# Patient Record
Sex: Female | Born: 1955 | Race: White | Hispanic: No | Marital: Single | Smoking: Current some day smoker
Health system: Southern US, Community
[De-identification: ages and names within clinical notes are randomized; demographics above are authoritative.]

## PROBLEM LIST (undated history)

## (undated) DIAGNOSIS — F79 Unspecified intellectual disabilities: Secondary | ICD-10-CM

## (undated) DIAGNOSIS — N200 Calculus of kidney: Secondary | ICD-10-CM

## (undated) DIAGNOSIS — I7121 Aneurysm of the ascending aorta, without rupture: Secondary | ICD-10-CM

## (undated) DIAGNOSIS — R05 Cough: Secondary | ICD-10-CM

## (undated) DIAGNOSIS — Z8489 Family history of other specified conditions: Secondary | ICD-10-CM

## (undated) DIAGNOSIS — I712 Thoracic aortic aneurysm, without rupture: Secondary | ICD-10-CM

## (undated) DIAGNOSIS — A419 Sepsis, unspecified organism: Secondary | ICD-10-CM

## (undated) HISTORY — DX: Unspecified intellectual disabilities: F79

---

## 2003-02-13 ENCOUNTER — Emergency Department (HOSPITAL_COMMUNITY): Admission: EM | Admit: 2003-02-13 | Discharge: 2003-02-13 | Payer: Self-pay | Admitting: Emergency Medicine

## 2003-09-10 ENCOUNTER — Other Ambulatory Visit: Admission: RE | Admit: 2003-09-10 | Discharge: 2003-09-10 | Payer: Self-pay | Admitting: Obstetrics and Gynecology

## 2017-10-31 ENCOUNTER — Emergency Department (HOSPITAL_COMMUNITY): Payer: Medicare Other | Admitting: Certified Registered Nurse Anesthetist

## 2017-10-31 ENCOUNTER — Encounter (HOSPITAL_COMMUNITY)
Admission: EM | Disposition: A | Discharge status: Home Health | Payer: Self-pay | Source: Home / Self Care | Attending: Internal Medicine

## 2017-10-31 ENCOUNTER — Other Ambulatory Visit: Payer: Self-pay

## 2017-10-31 ENCOUNTER — Inpatient Hospital Stay (HOSPITAL_COMMUNITY): Payer: Medicare Other

## 2017-10-31 ENCOUNTER — Encounter: Payer: Self-pay | Admitting: Emergency Medicine

## 2017-10-31 ENCOUNTER — Inpatient Hospital Stay (HOSPITAL_BASED_OUTPATIENT_CLINIC_OR_DEPARTMENT_OTHER)
Admission: EM | Admit: 2017-10-31 | Discharge: 2017-11-04 | DRG: 853 | Disposition: A | Discharge status: Home Health | Payer: Medicare Other | Attending: Internal Medicine | Admitting: Internal Medicine

## 2017-10-31 ENCOUNTER — Emergency Department (HOSPITAL_BASED_OUTPATIENT_CLINIC_OR_DEPARTMENT_OTHER): Payer: Medicare Other

## 2017-10-31 DIAGNOSIS — N179 Acute kidney failure, unspecified: Secondary | ICD-10-CM | POA: Diagnosis present

## 2017-10-31 DIAGNOSIS — E876 Hypokalemia: Secondary | ICD-10-CM | POA: Diagnosis present

## 2017-10-31 DIAGNOSIS — N200 Calculus of kidney: Secondary | ICD-10-CM

## 2017-10-31 DIAGNOSIS — A419 Sepsis, unspecified organism: Secondary | ICD-10-CM

## 2017-10-31 DIAGNOSIS — R911 Solitary pulmonary nodule: Secondary | ICD-10-CM | POA: Insufficient documentation

## 2017-10-31 DIAGNOSIS — F79 Unspecified intellectual disabilities: Secondary | ICD-10-CM | POA: Diagnosis present

## 2017-10-31 DIAGNOSIS — D649 Anemia, unspecified: Secondary | ICD-10-CM | POA: Diagnosis present

## 2017-10-31 DIAGNOSIS — N202 Calculus of kidney with calculus of ureter: Secondary | ICD-10-CM | POA: Diagnosis present

## 2017-10-31 DIAGNOSIS — R6521 Severe sepsis with septic shock: Secondary | ICD-10-CM | POA: Diagnosis present

## 2017-10-31 DIAGNOSIS — F17200 Nicotine dependence, unspecified, uncomplicated: Secondary | ICD-10-CM | POA: Diagnosis present

## 2017-10-31 DIAGNOSIS — I712 Thoracic aortic aneurysm, without rupture, unspecified: Secondary | ICD-10-CM

## 2017-10-31 DIAGNOSIS — Z9889 Other specified postprocedural states: Secondary | ICD-10-CM

## 2017-10-31 DIAGNOSIS — N211 Calculus in urethra: Secondary | ICD-10-CM | POA: Diagnosis present

## 2017-10-31 DIAGNOSIS — Z888 Allergy status to other drugs, medicaments and biological substances status: Secondary | ICD-10-CM | POA: Diagnosis not present

## 2017-10-31 DIAGNOSIS — N136 Pyonephrosis: Secondary | ICD-10-CM | POA: Diagnosis present

## 2017-10-31 DIAGNOSIS — R6520 Severe sepsis without septic shock: Secondary | ICD-10-CM

## 2017-10-31 DIAGNOSIS — N139 Obstructive and reflux uropathy, unspecified: Secondary | ICD-10-CM

## 2017-10-31 HISTORY — DX: Unspecified intellectual disabilities: F79

## 2017-10-31 HISTORY — DX: Severe sepsis with septic shock: R65.21

## 2017-10-31 HISTORY — DX: Sepsis, unspecified organism: A41.9

## 2017-10-31 HISTORY — PX: CYSTOSCOPY W/ URETERAL STENT PLACEMENT: SHX1429

## 2017-10-31 HISTORY — DX: Calculus of kidney: N20.0

## 2017-10-31 LAB — COMPREHENSIVE METABOLIC PANEL
ALK PHOS: 48 U/L (ref 38–126)
ALT: 12 U/L — AB (ref 14–54)
ANION GAP: 13 (ref 5–15)
AST: 36 U/L (ref 15–41)
Albumin: 3.4 g/dL — ABNORMAL LOW (ref 3.5–5.0)
BILIRUBIN TOTAL: 0.8 mg/dL (ref 0.3–1.2)
BUN: 17 mg/dL (ref 6–20)
CALCIUM: 9.4 mg/dL (ref 8.9–10.3)
CO2: 21 mmol/L — ABNORMAL LOW (ref 22–32)
CREATININE: 1.21 mg/dL — AB (ref 0.44–1.00)
Chloride: 103 mmol/L (ref 101–111)
GFR, EST AFRICAN AMERICAN: 54 mL/min — AB (ref >60)
GFR, EST NON AFRICAN AMERICAN: 47 mL/min — AB (ref >60)
Glucose, Bld: 119 mg/dL — ABNORMAL HIGH (ref 65–99)
Potassium: 3.3 mmol/L — ABNORMAL LOW (ref 3.5–5.1)
SODIUM: 137 mmol/L (ref 135–145)
TOTAL PROTEIN: 7 g/dL (ref 6.5–8.1)

## 2017-10-31 LAB — CBC WITH DIFFERENTIAL/PLATELET
BASOS ABS: 0 10*3/uL (ref 0.0–0.1)
Basophils Relative: 0 %
EOS ABS: 0 10*3/uL (ref 0.0–0.7)
EOS PCT: 0 %
HCT: 32.7 % — ABNORMAL LOW (ref 36.0–46.0)
Hemoglobin: 10.8 g/dL — ABNORMAL LOW (ref 12.0–15.0)
Lymphocytes Relative: 2 %
Lymphs Abs: 0.3 10*3/uL — ABNORMAL LOW (ref 0.7–4.0)
MCH: 28.6 pg (ref 26.0–34.0)
MCHC: 33 g/dL (ref 30.0–36.0)
MCV: 86.5 fL (ref 78.0–100.0)
MONO ABS: 0.2 10*3/uL (ref 0.1–1.0)
Monocytes Relative: 1 %
Neutro Abs: 13.3 10*3/uL — ABNORMAL HIGH (ref 1.7–7.7)
Neutrophils Relative %: 97 %
PLATELETS: 312 10*3/uL (ref 150–400)
RBC: 3.78 MIL/uL — AB (ref 3.87–5.11)
RDW: 13.7 % (ref 11.5–15.5)
WBC: 13.8 10*3/uL — AB (ref 4.0–10.5)

## 2017-10-31 LAB — I-STAT CG4 LACTIC ACID, ED
LACTIC ACID, VENOUS: 4.25 mmol/L — AB (ref 0.5–1.9)
LACTIC ACID, VENOUS: 5.61 mmol/L — AB (ref 0.5–1.9)

## 2017-10-31 LAB — PROTIME-INR
INR: 1.04
PROTHROMBIN TIME: 13.5 s (ref 11.4–15.2)

## 2017-10-31 SURGERY — CYSTOSCOPY, FLEXIBLE, WITH STENT REPLACEMENT
Anesthesia: General | Laterality: Left

## 2017-10-31 MED ORDER — NOREPINEPHRINE 4 MG/250ML-% IV SOLN
5.0000 ug/min | INTRAVENOUS | Status: DC
  Filled 2017-10-31: qty 250

## 2017-10-31 MED ORDER — MIDAZOLAM HCL 2 MG/2ML IJ SOLN: 2.0000 mg | INTRAMUSCULAR | Status: DC | PRN

## 2017-10-31 MED ORDER — PHENYLEPHRINE HCL 10 MG/ML IJ SOLN
INTRAVENOUS | Status: DC | PRN
  Administered 2017-10-31: 80 ug/min via INTRAVENOUS

## 2017-10-31 MED ORDER — PIPERACILLIN-TAZOBACTAM 3.375 G IVPB 30 MIN: 3.3750 g | Freq: Once | INTRAVENOUS | Status: DC

## 2017-10-31 MED ORDER — LACTATED RINGERS IV SOLN
INTRAVENOUS | Status: DC | PRN
  Administered 2017-10-31: 23:00:00 via INTRAVENOUS

## 2017-10-31 MED ORDER — LIDOCAINE 2% (20 MG/ML) 5 ML SYRINGE
INTRAMUSCULAR | Status: DC | PRN
  Administered 2017-10-31: 50 mg via INTRAVENOUS

## 2017-10-31 MED ORDER — SUCCINYLCHOLINE CHLORIDE 200 MG/10ML IV SOSY
PREFILLED_SYRINGE | INTRAVENOUS | Status: AC
  Filled 2017-10-31: qty 10

## 2017-10-31 MED ORDER — PROPOFOL 10 MG/ML IV BOLUS
INTRAVENOUS | Status: DC | PRN
  Administered 2017-10-31: 80 mg via INTRAVENOUS

## 2017-10-31 MED ORDER — ONDANSETRON HCL 4 MG/2ML IJ SOLN
4.0000 mg | Freq: Once | INTRAMUSCULAR | Status: AC
  Administered 2017-10-31: 4 mg via INTRAVENOUS
  Filled 2017-10-31: qty 2

## 2017-10-31 MED ORDER — ACETAMINOPHEN 160 MG/5ML PO SOLN
650.0000 mg | Freq: Four times a day (QID) | ORAL | Status: DC | PRN
  Administered 2017-11-02: 650 mg via ORAL
  Filled 2017-10-31: qty 20.3

## 2017-10-31 MED ORDER — SUCCINYLCHOLINE CHLORIDE 200 MG/10ML IV SOSY
PREFILLED_SYRINGE | INTRAVENOUS | Status: DC | PRN
  Administered 2017-10-31: 80 mg via INTRAVENOUS

## 2017-10-31 MED ORDER — PIPERACILLIN-TAZOBACTAM 3.375 G IVPB
3.3750 g | Freq: Three times a day (TID) | INTRAVENOUS | Status: DC
  Administered 2017-11-01 – 2017-11-04 (×11): 3.375 g via INTRAVENOUS
  Filled 2017-10-31 (×13): qty 50

## 2017-10-31 MED ORDER — POTASSIUM CHLORIDE 10 MEQ/100ML IV SOLN
10.0000 meq | INTRAVENOUS | Status: DC
  Filled 2017-10-31 (×4): qty 100

## 2017-10-31 MED ORDER — PIPERACILLIN-TAZOBACTAM 3.375 G IVPB 30 MIN
3.3750 g | Freq: Once | INTRAVENOUS | Status: AC
  Administered 2017-10-31: 3.375 g via INTRAVENOUS
  Filled 2017-10-31 (×2): qty 50

## 2017-10-31 MED ORDER — SODIUM CHLORIDE 0.9 % IV SOLN: 250.0000 mL | INTRAVENOUS | Status: DC | PRN

## 2017-10-31 MED ORDER — VANCOMYCIN HCL IN DEXTROSE 1-5 GM/200ML-% IV SOLN: 1000.0000 mg | Freq: Once | INTRAVENOUS | Status: DC

## 2017-10-31 MED ORDER — DEXAMETHASONE SODIUM PHOSPHATE 10 MG/ML IJ SOLN
INTRAMUSCULAR | Status: DC | PRN
  Administered 2017-10-31: 10 mg via INTRAVENOUS

## 2017-10-31 MED ORDER — FENTANYL BOLUS VIA INFUSION
50.0000 ug | INTRAVENOUS | Status: DC | PRN
  Filled 2017-10-31: qty 50

## 2017-10-31 MED ORDER — HEPARIN SODIUM (PORCINE) 5000 UNIT/ML IJ SOLN
5000.0000 [IU] | Freq: Three times a day (TID) | INTRAMUSCULAR | Status: DC
  Administered 2017-11-01 – 2017-11-04 (×9): 5000 [IU] via SUBCUTANEOUS
  Filled 2017-10-31 (×11): qty 1

## 2017-10-31 MED ORDER — SODIUM CHLORIDE 0.9 % IV SOLN
INTRAVENOUS | Status: DC | PRN
  Administered 2017-10-31: 50 mL

## 2017-10-31 MED ORDER — STERILE WATER FOR IRRIGATION IR SOLN
Status: DC | PRN
  Administered 2017-10-31: 3000 mL

## 2017-10-31 MED ORDER — ONDANSETRON HCL 4 MG/2ML IJ SOLN
INTRAMUSCULAR | Status: AC
  Filled 2017-10-31: qty 2

## 2017-10-31 MED ORDER — FENTANYL 2500MCG IN NS 250ML (10MCG/ML) PREMIX INFUSION
25.0000 ug/h | INTRAVENOUS | Status: DC
  Filled 2017-10-31: qty 250

## 2017-10-31 MED ORDER — LACTATED RINGERS IV SOLN
INTRAVENOUS | Status: DC
  Administered 2017-11-01 (×3): via INTRAVENOUS

## 2017-10-31 MED ORDER — ONDANSETRON HCL 4 MG/2ML IJ SOLN
4.0000 mg | Freq: Four times a day (QID) | INTRAMUSCULAR | Status: DC | PRN
Start: 2017-10-31 — End: 2017-11-04

## 2017-10-31 MED ORDER — ONDANSETRON HCL 4 MG/2ML IJ SOLN
INTRAMUSCULAR | Status: DC | PRN
  Administered 2017-10-31: 4 mg via INTRAVENOUS

## 2017-10-31 MED ORDER — INSULIN ASPART 100 UNIT/ML ~~LOC~~ SOLN: 1.0000 [IU] | SUBCUTANEOUS | Status: DC

## 2017-10-31 MED ORDER — ACETAMINOPHEN 500 MG PO TABS
1000.0000 mg | ORAL_TABLET | Freq: Once | ORAL | Status: AC
  Administered 2017-10-31: 1000 mg via ORAL

## 2017-10-31 MED ORDER — VANCOMYCIN HCL IN DEXTROSE 1-5 GM/200ML-% IV SOLN
1000.0000 mg | Freq: Once | INTRAVENOUS | Status: AC
  Administered 2017-10-31: 1000 mg via INTRAVENOUS
  Filled 2017-10-31: qty 200

## 2017-10-31 MED ORDER — DOCUSATE SODIUM 50 MG/5ML PO LIQD
100.0000 mg | Freq: Two times a day (BID) | ORAL | Status: DC
  Administered 2017-11-01 – 2017-11-02 (×3): 100 mg
  Filled 2017-10-31 (×5): qty 10

## 2017-10-31 MED ORDER — FENTANYL CITRATE (PF) 100 MCG/2ML IJ SOLN: 50.0000 ug | Freq: Once | INTRAMUSCULAR | Status: DC

## 2017-10-31 MED ORDER — LIDOCAINE 2% (20 MG/ML) 5 ML SYRINGE
INTRAMUSCULAR | Status: AC
  Filled 2017-10-31: qty 5

## 2017-10-31 MED ORDER — SODIUM CHLORIDE 0.9 % IV BOLUS (SEPSIS)
500.0000 mL | Freq: Once | INTRAVENOUS | Status: AC
  Administered 2017-10-31: 500 mL via INTRAVENOUS

## 2017-10-31 MED ORDER — FENTANYL CITRATE (PF) 100 MCG/2ML IJ SOLN
INTRAMUSCULAR | Status: AC
  Filled 2017-10-31: qty 2

## 2017-10-31 MED ORDER — VANCOMYCIN HCL IN DEXTROSE 750-5 MG/150ML-% IV SOLN
750.0000 mg | INTRAVENOUS | Status: DC
  Administered 2017-11-01: 750 mg via INTRAVENOUS
  Filled 2017-10-31 (×2): qty 150

## 2017-10-31 MED ORDER — IOPAMIDOL (ISOVUE-300) INJECTION 61%
100.0000 mL | Freq: Once | INTRAVENOUS | Status: AC | PRN
  Administered 2017-10-31: 100 mL via INTRAVENOUS

## 2017-10-31 MED ORDER — DEXAMETHASONE SODIUM PHOSPHATE 10 MG/ML IJ SOLN
INTRAMUSCULAR | Status: AC
  Filled 2017-10-31: qty 1

## 2017-10-31 MED ORDER — SODIUM CHLORIDE 0.9 % IV BOLUS (SEPSIS)
250.0000 mL | Freq: Once | INTRAVENOUS | Status: AC
  Administered 2017-10-31: 250 mL via INTRAVENOUS

## 2017-10-31 MED ORDER — PANTOPRAZOLE SODIUM 40 MG IV SOLR: 40.0000 mg | Freq: Every day | INTRAVENOUS | Status: DC

## 2017-10-31 MED ORDER — PROPOFOL 10 MG/ML IV BOLUS
INTRAVENOUS | Status: AC
  Filled 2017-10-31: qty 20

## 2017-10-31 MED ORDER — ACETAMINOPHEN 500 MG PO TABS
ORAL_TABLET | ORAL | Status: AC
  Filled 2017-10-31: qty 2

## 2017-10-31 MED ORDER — SODIUM CHLORIDE 0.9 % IV BOLUS (SEPSIS)
1000.0000 mL | Freq: Once | INTRAVENOUS | Status: AC
  Administered 2017-10-31: 1000 mL via INTRAVENOUS

## 2017-10-31 MED ORDER — FENTANYL CITRATE (PF) 100 MCG/2ML IJ SOLN
INTRAMUSCULAR | Status: DC | PRN
  Administered 2017-10-31: 50 ug via INTRAVENOUS

## 2017-10-31 MED ORDER — ACETAMINOPHEN 650 MG RE SUPP
650.0000 mg | Freq: Four times a day (QID) | RECTAL | Status: DC | PRN
  Filled 2017-10-31: qty 1

## 2017-10-31 MED ORDER — DOCUSATE SODIUM 50 MG/5ML PO LIQD: 100.0000 mg | Freq: Two times a day (BID) | ORAL | Status: DC | PRN

## 2017-10-31 SURGICAL SUPPLY — 15 items
BAG URINE DRAINAGE (UROLOGICAL SUPPLIES) ×3 IMPLANT
BAG URO CATCHER STRL LF (MISCELLANEOUS) ×3 IMPLANT
CATH FOLEY 2WAY SLVR 30CC 16FR (CATHETERS) ×3 IMPLANT
CATH URET 5FR 28IN OPEN ENDED (CATHETERS) ×3 IMPLANT
CLOTH BEACON ORANGE TIMEOUT ST (SAFETY) ×3 IMPLANT
COVER FOOTSWITCH UNIV (MISCELLANEOUS) IMPLANT
COVER SURGICAL LIGHT HANDLE (MISCELLANEOUS) ×3 IMPLANT
GLOVE BIOGEL M STRL SZ7.5 (GLOVE) ×3 IMPLANT
GOWN STRL REUS W/TWL LRG LVL3 (GOWN DISPOSABLE) ×6 IMPLANT
GUIDEWIRE STR DUAL SENSOR (WIRE) ×3 IMPLANT
MANIFOLD NEPTUNE II (INSTRUMENTS) ×3 IMPLANT
PACK CYSTO (CUSTOM PROCEDURE TRAY) ×3 IMPLANT
STENT URET 6FRX24 CONTOUR (STENTS) ×3 IMPLANT
TUBING CONNECTING 10 (TUBING) IMPLANT
TUBING CONNECTING 10' (TUBING)

## 2017-10-31 NOTE — Op Note (Signed)
Preoperative diagnosis:  1. Infected left ureteral stone with sepsis   Postoperative diagnosis:  1. same   Procedure:  1. Cystoscopy 2. left ureteral stent placement 3. left retrograde pyelography with interpretation   Surgeon: Crist FatBenjamin W. Naijah Lacek, MD  Anesthesia: General  Complications: None  Intraoperative findings:  left retrograde pyelography demonstrated a filling defect within the left ureter consistent with the patient's known calculus without other abnormalities.  EBL: Minimal  Specimens: None  Indication: Christina Everett is a 62 y.o. patient with septoid physiology and an obstructing left ureteral stone. After reviewing the management options for treatment, he elected to proceed with the above surgical procedure(s). We have discussed the potential benefits and risks of the procedure, side effects of the proposed treatment, the likelihood of the patient achieving the goals of the procedure, and any potential problems that might occur during the procedure or recuperation. Informed consent has been obtained.  Description of procedure:  The patient was taken to the operating room and general anesthesia was induced.  The patient was placed in the dorsal lithotomy position, prepped and draped in the usual sterile fashion, and preoperative antibiotics were administered. A preoperative time-out was performed.   Cystourethroscopy was performed.  The patient's urethra was examined and was normal. The bladder was then systematically examined in its entirety. There was no evidence for any bladder tumors, stones, or other mucosal pathology.    Attention then turned to the leftureteral orifice and a ureteral catheter was used to intubate the ureteral orifice.  Omnipaque contrast was injected through the ureteral catheter and a retrograde pyelogram was performed with findings as dictated above.  A 0.38 sensor guidewire was then advanced up the left ureter into the renal pelvis under  fluoroscopic guidance.  The wire was then backloaded through the cystoscope and a ureteral stent was advance over the wire using Seldinger technique.  The stent was positioned appropriately under fluoroscopic and cystoscopic guidance.  The wire was then removed with an adequate stent curl noted in the renal pelvis as well as in the bladder.  The bladder was then emptied and the procedure ended.  The patient appeared to tolerate the procedure well and without complications.  The patient was able to be awakened and transferred to the recovery unit in satisfactory condition.    Crist FatBenjamin W. Tamla Winkels, M.D.

## 2017-10-31 NOTE — ED Notes (Signed)
Purewick applied to patient's perineal area after perineal care was done with family in attendance.

## 2017-10-31 NOTE — ED Triage Notes (Signed)
Presents with fever of 102.0 at home, nausea, vomiting and diarrhea and leg pain that began this AM. Per sister, who is her caretaker, pt was given 2 tylenol at 3 pm. PT is mentally handicapped. Pt is febrile here at 101.6 and bp is 103/55, very warm to touch.

## 2017-10-31 NOTE — ED Notes (Signed)
Date and time results received: 10/31/17 1837 (use smartphrase ".now" to insert current time)  Test:Istat Lactic acid Critical Value: 4.25  Name of Provider Notified: Dr. Fredderick PhenixBelfi  Orders Received? Or Actions Taken?: New orders noted

## 2017-10-31 NOTE — ED Provider Notes (Signed)
[facility] EMERGENCY DEPARTMENT Provider Note   CSN: [id number] Arrival date & time: 10/31/17  1747     History   Chief Complaint Chief Complaint  Patient presents with  . Fever    HPI Christina Everett is a 62 y.o. female.  Patient is a 62 year old female who has a history of mental retardation who presents with fever.  She lives at home with her older sister.  Her older sister states that she has been more sleepy throughout today.  She has had one episode of diarrhea and several episodes of vomiting.  She has had some chills and muscle cramping.  She has had a little bit of coughing over the last few days.  No reported abdominal pain.  No difficulty urinating.  No wounds or sores.  No rashes.     Past Medical History:  Diagnosis Date  . Mentally disabled     There are no active problems to display for this patient.   History reviewed. No pertinent surgical history.   OB History   None      Home Medications    Prior to Admission medications   Not on File    Family History History reviewed. No pertinent family history.  Social History Social History   Tobacco Use  . Smoking status: Current Some Day Smoker  . Smokeless tobacco: Current User  Substance Use Topics  . Alcohol use: Never    Frequency: Never  . Drug use: Never     Allergies   Patient has no known allergies.   Review of Systems Review of Systems  Constitutional: Positive for chills and fever. Negative for diaphoresis and fatigue.  HENT: Negative for congestion, rhinorrhea and sneezing.   Eyes: Negative.   Respiratory: Negative for cough, chest tightness and shortness of breath.   Cardiovascular: Negative for chest pain and leg swelling.  Gastrointestinal: Positive for diarrhea, nausea and vomiting. Negative for abdominal pain and blood in stool.  Genitourinary: Negative for difficulty urinating, flank pain, frequency and hematuria.  Musculoskeletal: Positive for myalgias.  Negative for arthralgias and back pain.  Skin: Negative for rash.  Neurological: Negative for dizziness, speech difficulty, weakness, numbness and headaches.     Physical Exam Updated Vital Signs BP (!) 103/46   Pulse (!) 111   Temp (!) 104 F (40 C) (Rectal)   Resp (!) 30   Ht 5\' 6"  (1.676 m)   Wt 54.4 kg (120 lb)   SpO2 96%   BMI 19.37 kg/m   Physical Exam  Constitutional: She is oriented to person, place, and time. She appears well-developed and well-nourished.  HENT:  Head: Normocephalic and atraumatic.  Eyes: Pupils are equal, round, and reactive to light.  Neck: Normal range of motion. Neck supple.  Cardiovascular: Regular rhythm and normal heart sounds. Tachycardia present.  Pulmonary/Chest: Effort normal and breath sounds normal. Tachypnea noted. No respiratory distress. She has no wheezes. She has no rales. She exhibits no tenderness.  Abdominal: Soft. Bowel sounds are normal. There is no tenderness. There is no rebound and no guarding.  Musculoskeletal: Normal range of motion. She exhibits no edema.  Lymphadenopathy:    She has no cervical adenopathy.  Neurological: She is alert and oriented to person, place, and time.  Skin: Skin is warm and dry. No rash noted.  Psychiatric: She has a normal mood and affect.     ED Treatments / Results  Labs (all labs ordered are listed, but only abnormal results are displayed) Labs Reviewed  COMPREHENSIVE METABOLIC PANEL - Abnormal; Notable for the following components:      Result Value   Potassium 3.3 (*)    CO2 21 (*)    Glucose, Bld 119 (*)    Creatinine, Ser 1.21 (*)    Albumin 3.4 (*)    ALT 12 (*)    GFR calc non Af Amer 47 (*)    GFR calc Af Amer 54 (*)    All other components within normal limits  CBC WITH DIFFERENTIAL/PLATELET - Abnormal; Notable for the following components:   WBC 13.8 (*)    RBC 3.78 (*)    Hemoglobin 10.8 (*)    HCT 32.7 (*)    Neutro Abs 13.3 (*)    Lymphs Abs 0.3 (*)    All other  components within normal limits  I-STAT CG4 LACTIC ACID, ED - Abnormal; Notable for the following components:   Lactic Acid, Venous 4.25 (*)    All other components within normal limits  I-STAT CG4 LACTIC ACID, ED - Abnormal; Notable for the following components:   Lactic Acid, Venous 5.61 (*)    All other components within normal limits  CULTURE, BLOOD (ROUTINE X 2)  CULTURE, BLOOD (ROUTINE X 2)  PROTIME-INR  URINALYSIS, ROUTINE W REFLEX MICROSCOPIC  I-STAT CG4 LACTIC ACID, ED  I-STAT CG4 LACTIC ACID, ED    EKG EKG Interpretation  Date/Time:  Sunday October 31 2017 18:18:12 EDT Ventricular Rate:  119 PR Interval:    QRS Duration: 95 QT Interval:  336 QTC Calculation: 473 R Axis:   -18 Text Interpretation:  Sinus tachycardia Borderline left axis deviation Abnormal R-wave progression, early transition Borderline T wave abnormalities Baseline wander in lead(s) V2 No old tracing to compare Confirmed by Rolan Bucco [phone]) on 10/31/2017 7:45:11 PM   Radiology Dg Chest 2 View  Result Date: 10/31/2017 CLINICAL DATA:  Nausea with vomiting and diarrhea. EXAM: CHEST - 2 VIEW COMPARISON:  None. FINDINGS: Asymmetric masslike opacity identified in the right apex. Lungs are hyperexpanded. No edema or focal airspace consolidation. The cardiopericardial silhouette is within normal limits for size. The visualized bony structures of the thorax are intact. IMPRESSION: Asymmetric masslike density in the right apex. CT chest recommended to further evaluate. Electronically Signed   By: Kennith Center M.D.   On: 10/31/2017 18:26   Ct Chest W Contrast  Result Date: 10/31/2017 CLINICAL DATA:  Generalized abdominal pain with fever of unknown origin. EXAM: CT CHEST, ABDOMEN AND PELVIS WITHOUT CONTRAST TECHNIQUE: Multidetector CT imaging of the chest, abdomen and pelvis was performed following the standard protocol without IV contrast. COMPARISON:  Chest x-ray from earlier today FINDINGS: CT CHEST FINDINGS  Cardiovascular: Heart size upper normal to mildly increased. No pericardial effusion. Coronary artery calcification is evident. Ascending thoracic aorta measures up to 4.3 cm diameter. Mediastinum/Nodes: No mediastinal lymphadenopathy. 3.3 x 1.0 x 2.7 cm homogeneous lesion is identified in the left mediastinum, the level of the AP window. This lesion has an imperceptible wall and approaches water attenuation, most consistent with a cyst. There is no hilar lymphadenopathy. There is no axillary lymphadenopathy. Lungs/Pleura: Biapical pleuroparenchymal scarring is identified. By CT, the findings are symmetric and platelike, most compatible with scarring. The asymmetry suggested on the chest x-ray earlier today is not apparent on CT. 7 mm left upper lobe nodule (image 25/series 4) may also be scarring related. Expiratory imaging of the lung shows subtle mosaic attenuation, likely related to air trapping. No dense focal airspace consolidation. No pleural effusion. Musculoskeletal:  Bone windows reveal no worrisome lytic or sclerotic osseous lesions. CT ABDOMEN PELVIS FINDINGS Hepatobiliary: No focal abnormality within the liver parenchyma. There is no evidence for gallstones, gallbladder wall thickening, or pericholecystic fluid. No intrahepatic or extrahepatic biliary dilation. Pancreas: No focal mass lesion. No dilatation of the main duct. No intraparenchymal cyst. No peripancreatic edema. Spleen: No splenomegaly. No focal mass lesion. Adrenals/Urinary Tract: No adrenal nodule or mass. 2 mm nonobstructing stone identified lower pole right kidney. Right kidney otherwise unremarkable. Right ureter is normal. Moderate to high-grade left hydronephrosis is identified with a 2.1 x 1.4 x 1.3 cm gas containing stone lodged at the UPJ. Decreased perfusion to the left kidney is compatible with obstructive uropathy. Gas bubbles are seen in the dilated left renal pelvis and in the dilated left renal calices. Left ureter distal to  the UPJ is nondilated, but multiple gas bubbles are seen in the mid and distal segments. Urinary bladder is moderately distended with intraluminal gas. Stomach/Bowel: Tiny hiatal hernia. Stomach otherwise unremarkable. Duodenum is normally positioned as is the ligament of Treitz. No small bowel wall thickening. No small bowel dilatation. The terminal ileum is normal. The appendix is not visualized, but there is no edema or inflammation in the region of the cecum. Moderate stool volume in the right colon with decompressed transverse and left colon. No obstructing mass lesion evident although motion artifact degrades imaging through the mid transverse segment. Vascular/Lymphatic: There is abdominal aortic atherosclerosis without aneurysm. There is no gastrohepatic or hepatoduodenal ligament lymphadenopathy. No intraperitoneal or retroperitoneal lymphadenopathy. Small left para-aortic lymph nodes are evident. No pelvic sidewall lymphadenopathy. Reproductive: The uterus has normal CT imaging appearance. There is no adnexal mass. Other: No intraperitoneal free fluid. Musculoskeletal: Bone windows reveal no worrisome lytic or sclerotic osseous lesions. 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 IMPRESSION: 1. Moderate to high-grade left hydroureteronephrosis with decreased perfusion of the left kidney consistent with obstructive uropathy. These changes are related to a 2.1 X 1.4 x 1.3 cm gas containing stone lodged at the UPJ. Additional gas is identified in the dilated renal pelvis and collecting systems of the left kidney as well as along the course of the nondilated left ureter distal to the stone and in the urinary bladder. Imaging features compatible with infection and left pyonephrosis. 2. Symmetric biapical pleuroparenchymal scarring by CT. No evidence for right apical mass as suggested on chest x-ray earlier today. 3. 7 mm left upper lobe pulmonary nodule may  be scar. Non-contrast chest CT at 6-12 months is recommended. If the nodule is stable at time of repeat CT, then future CT at 18-24 months (from today's scan) is considered optional for low-risk patients, but is recommended for high-risk patients. This recommendation follows the consensus statement: Guidelines for Management of Incidental Pulmonary Nodules Detected on CT Images: From the Fleischner Society 2017; Radiology 2017; 284:228-243. 4. 4.3 cm diameter ascending thoracic aortic aneurysm. Recommend annual imaging followup by CTA or MRA. This recommendation follows 2010 ACCF/AHA/AATS/ACR/ASA/SCA/SCAI/SIR/STS/SVM Guidelines for the Diagnosis and Management of Patients with Thoracic Aortic Disease. Circulation. 2010; 121: e266-e369 5. 3.3 x 1.0 x 2.7 cm cystic lesion in the left mediastinum. Imaging features most suggestive of pericardial cyst. Attention on follow-up recommended to ensure stability. 6. 2 mm nonobstructing right renal stone. 7.  Aortic Atherosclerois (ICD10-170.0) Electronically Signed   By: Kennith Center M.D.   On: 10/31/2017 20:35   Ct Abdomen Pelvis W Contrast  Result Date: 10/31/2017 CLINICAL  DATA:  Generalized abdominal pain with fever of unknown origin. EXAM: CT CHEST, ABDOMEN AND PELVIS WITHOUT CONTRAST TECHNIQUE: Multidetector CT imaging of the chest, abdomen and pelvis was performed following the standard protocol without IV contrast. COMPARISON:  Chest x-ray from earlier today FINDINGS: CT CHEST FINDINGS Cardiovascular: Heart size upper normal to mildly increased. No pericardial effusion. Coronary artery calcification is evident. Ascending thoracic aorta measures up to 4.3 cm diameter. Mediastinum/Nodes: No mediastinal lymphadenopathy. 3.3 x 1.0 x 2.7 cm homogeneous lesion is identified in the left mediastinum, the level of the AP window. This lesion has an imperceptible wall and approaches water attenuation, most consistent with a cyst. There is no hilar lymphadenopathy. There is no  axillary lymphadenopathy. Lungs/Pleura: Biapical pleuroparenchymal scarring is identified. By CT, the findings are symmetric and platelike, most compatible with scarring. The asymmetry suggested on the chest x-ray earlier today is not apparent on CT. 7 mm left upper lobe nodule (image 25/series 4) may also be scarring related. Expiratory imaging of the lung shows subtle mosaic attenuation, likely related to air trapping. No dense focal airspace consolidation. No pleural effusion. Musculoskeletal: Bone windows reveal no worrisome lytic or sclerotic osseous lesions. CT ABDOMEN PELVIS FINDINGS Hepatobiliary: No focal abnormality within the liver parenchyma. There is no evidence for gallstones, gallbladder wall thickening, or pericholecystic fluid. No intrahepatic or extrahepatic biliary dilation. Pancreas: No focal mass lesion. No dilatation of the main duct. No intraparenchymal cyst. No peripancreatic edema. Spleen: No splenomegaly. No focal mass lesion. Adrenals/Urinary Tract: No adrenal nodule or mass. 2 mm nonobstructing stone identified lower pole right kidney. Right kidney otherwise unremarkable. Right ureter is normal. Moderate to high-grade left hydronephrosis is identified with a 2.1 x 1.4 x 1.3 cm gas containing stone lodged at the UPJ. Decreased perfusion to the left kidney is compatible with obstructive uropathy. Gas bubbles are seen in the dilated left renal pelvis and in the dilated left renal calices. Left ureter distal to the UPJ is nondilated, but multiple gas bubbles are seen in the mid and distal segments. Urinary bladder is moderately distended with intraluminal gas. Stomach/Bowel: Tiny hiatal hernia. Stomach otherwise unremarkable. Duodenum is normally positioned as is the ligament of Treitz. No small bowel wall thickening. No small bowel dilatation. The terminal ileum is normal. The appendix is not visualized, but there is no edema or inflammation in the region of the cecum. Moderate stool volume  in the right colon with decompressed transverse and left colon. No obstructing mass lesion evident although motion artifact degrades imaging through the mid transverse segment. Vascular/Lymphatic: There is abdominal aortic atherosclerosis without aneurysm. There is no gastrohepatic or hepatoduodenal ligament lymphadenopathy. No intraperitoneal or retroperitoneal lymphadenopathy. Small left para-aortic lymph nodes are evident. No pelvic sidewall lymphadenopathy. Reproductive: The uterus has normal CT imaging appearance. There is no adnexal mass. Other: No intraperitoneal free fluid. Musculoskeletal: Bone windows reveal no worrisome lytic or sclerotic osseous lesions. 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 IMPRESSION: 1. Moderate to high-grade left hydroureteronephrosis with decreased perfusion of the left kidney consistent with obstructive uropathy. These changes are related to a 2.1 X 1.4 x 1.3 cm gas containing stone lodged at the UPJ. Additional gas is identified in the dilated renal pelvis and collecting systems of the left kidney as well as along the course of the nondilated left ureter distal to the stone and in the urinary bladder. Imaging features compatible with infection and left pyonephrosis. 2. Symmetric biapical pleuroparenchymal scarring by  CT. No evidence for right apical mass as suggested on chest x-ray earlier today. 3. 7 mm left upper lobe pulmonary nodule may be scar. Non-contrast chest CT at 6-12 months is recommended. If the nodule is stable at time of repeat CT, then future CT at 18-24 months (from today's scan) is considered optional for low-risk patients, but is recommended for high-risk patients. This recommendation follows the consensus statement: Guidelines for Management of Incidental Pulmonary Nodules Detected on CT Images: From the Fleischner Society 2017; Radiology 2017; 284:228-243. 4. 4.3 cm diameter ascending thoracic aortic  aneurysm. Recommend annual imaging followup by CTA or MRA. This recommendation follows 2010 ACCF/AHA/AATS/ACR/ASA/SCA/SCAI/SIR/STS/SVM Guidelines for the Diagnosis and Management of Patients with Thoracic Aortic Disease. Circulation. 2010; 121: [id number] 5. 3.3 x 1.0 x 2.7 cm cystic lesion in the left mediastinum. Imaging features most suggestive of pericardial cyst. Attention on follow-up recommended to ensure stability. 6. 2 mm nonobstructing right renal stone. 7.  Aortic Atherosclerois (ICD10-170.0) Electronically Signed   By: Kennith Center M.D.   On: 10/31/2017 20:35    Procedures Procedures (including critical care time)  Medications Ordered in ED Medications  vancomycin (VANCOCIN) IVPB 750 mg/150 ml premix (has no administration in time range)  piperacillin-tazobactam (ZOSYN) IVPB 3.375 g (has no administration in time range)  ondansetron (ZOFRAN) injection 4 mg (4 mg Intravenous Given 10/31/17 1828)  sodium chloride 0.9 % bolus 1,000 mL (0 mLs Intravenous Stopped 10/31/17 2005)    And  sodium chloride 0.9 % bolus 500 mL (0 mLs Intravenous Stopped 10/31/17 2005)    And  sodium chloride 0.9 % bolus 250 mL (0 mLs Intravenous Stopped 10/31/17 2042)  piperacillin-tazobactam (ZOSYN) IVPB 3.375 g (0 g Intravenous Stopped 10/31/17 1904)  vancomycin (VANCOCIN) IVPB 1000 mg/200 mL premix (0 mg Intravenous Stopped 10/31/17 2042)  iopamidol (ISOVUE-300) 61 % injection 100 mL (100 mLs Intravenous Contrast Given 10/31/17 1934)  acetaminophen (TYLENOL) tablet 1,000 mg (1,000 mg Oral Given 10/31/17 2005)     Initial Impression / Assessment and Plan / ED Course  I have reviewed the triage vital signs and the nursing notes.  Pertinent labs & imaging results that were available during my care of the patient were reviewed by me and considered in my medical decision making (see chart for details).     Patient is a 62 year old female who presents with fever chills and vomiting.  She has a mild cough but no abdominal  pain on exam.  However chest x-ray appeared abnormal and a CT scan was ordered.  I added on a CT scan of the abdomen and pelvis which did show severe infection in the left kidney with air bubbles and an obstructing stone.  On arrival, she was treated aggressively with IV antibiotics and for 30 cc/kg fluid bolus.  Her blood pressures have remained soft but she has not required pressors.  Her lactate has climbed in between the fluid boluses.  Her white count is elevated.  Other labs reviewed.  I spoke with Dr. Marlou Porch with urology who requested patient to be transferred to the [facility] emergency department as he likely will need to take her to surgery tonight.  I updated the family on this.  I also spoke with Dr. Fayrene Fearing in the emergency department who has accepted the patient for transfer.  I did also speak with Dr. Julian Reil with the hospitalist service to give him a heads up the patient will need to be admitted post surgery.  It is unclear at this point whether  she will need a stepdown or ICU bed.  CRITICAL CARE Performed by: Rolan Bucco Total critical care time: 75 minutes Critical care time was exclusive of separately billable procedures and treating other patients. Critical care was necessary to treat or prevent imminent or life-threatening deterioration. Critical care was time spent personally by me on the following activities: development of treatment plan with patient and/or surrogate as well as nursing, discussions with consultants, evaluation of patient's response to treatment, examination of patient, obtaining history from patient or surrogate, ordering and performing treatments and interventions, ordering and review of laboratory studies, ordering and review of radiographic studies, pulse oximetry and re-evaluation of patient's condition.   Final Clinical Impressions(s) / ED Diagnoses   Final diagnoses:  Severe sepsis [facility])  Acute pyonephrosis  Kidney stone  Pulmonary nodule  Thoracic  aortic aneurysm without rupture [facility])    ED Discharge Orders    None       Rolan Bucco, MD 10/31/17 2124
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
## 2017-10-31 NOTE — ED Notes (Signed)
EDP notified of pts temp. Verbal order given.

## 2017-10-31 NOTE — ED Notes (Addendum)
[facility]  Informed Misty StanleyLisa that I did not give Zosyn 3.375 prior to discharge.

## 2017-10-31 NOTE — ED Notes (Addendum)
Panic value on LAC of 4.25 hand delivered to Dr. Fredderick PhenixBelfi @ [phone] by this RT.

## 2017-10-31 NOTE — ED Notes (Signed)
Panic value on LAC 5.61 hand delivered to Dr. Fredderick PhenixBelfi at 2023 by this RT.

## 2017-10-31 NOTE — Consult Note (Signed)
I have been asked to see the patient by Dr. Rolan BuccoMelanie Belfi, for evaluation and management of left proximal ureteral stone with associated sepsis.  History of present illness: 62 year old female who presented to the [facility] ED earlier this afternoon with complaints of nausea and vomiting.  The patient is mentally disabled, but has no other significant medical problems.  She was not complaining of any significant pain but rather of chills and muscle cramps.  She also has been more lethargic over the past day.  She has no history of kidney stones or recurrent UTIs.  In the emergency department a CT scan was performed which demonstrated a large 2 cm stone that is obstructing in the left proximal ureter with air in the collecting system and a delayed nephrogram.  Her blood pressure was soft, her heart rate was elevated, and her fever spiked up to 104.  Her lactate is currently rising, the last level was 5.  Given her septic physiology urology was consulted urgently for further management and evaluation.  Review of systems: A 12 point comprehensive review of systems was unable to be obtained due to the patient's mental status.  There are no active problems to display for this patient.   No current facility-administered medications on file prior to encounter.    No current outpatient medications on file prior to encounter.    Past Medical History:  Diagnosis Date  . Mentally disabled     History reviewed. No pertinent surgical history.  Social History   Tobacco Use  . Smoking status: Current Some Day Smoker  . Smokeless tobacco: Current User  Substance Use Topics  . Alcohol use: Never    Frequency: Never  . Drug use: Never    History reviewed. No pertinent family history.  PE: Vitals:   10/31/17 2115 10/31/17 2130 10/31/17 2145 10/31/17 2200  BP: (!) 102/54 (!) 97/47 (!) 94/49 (!) 104/58  Pulse:      Resp: (!) 27 (!) 24 (!) 33 (!) 37  Temp:      TempSrc:      SpO2:       Weight:      Height:       Patient appears to be in mild distress  patient is alert  Atraumatic normocephalic head No cervical or supraclavicular lymphadenopathy appreciated No increased work of breathing, no audible wheezes/rhonchi tachycardic Abdomen is soft, nontender, nondistended Lower extremities are symmetric without appreciable edema Grossly neurologically intact No identifiable skin lesions  Recent Labs    10/31/17 1823  WBC 13.8*  HGB 10.8*  HCT 32.7*   Recent Labs    10/31/17 1823  NA 137  K 3.3*  CL 103  CO2 21*  GLUCOSE 119*  BUN 17  CREATININE 1.21*  CALCIUM 9.4   Recent Labs    10/31/17 1823  INR 1.04   No results for input(s): LABURIN in the last 72 hours. No results found for this or any previous visit.  Imaging: I have reviewed the patient's CT scan which was obtained on 10/31/17 which demonstrates a large obstructing stone with air in the collecting system and delayed nephrogram consistent with a infected left kidney.  Imp: The patient has developed sepsis from an infected obstructing stone.  Recommendations: I spoke with the family about this patient in significant detail and recommended that we proceed to the operating room on an emergent basis to place a stent and drain the kidney infection on the left side.  Following this, the  patient will likely need ICU level care.  Once the patient has been treated adequately for her infection she will need more definitive treatment of that stone.  Berniece Salines W

## 2017-10-31 NOTE — ED Notes (Signed)
Attempted to call [facility] but Press photographerCharge nurse is busy.  She stated that she will call me back.

## 2017-10-31 NOTE — Anesthesia Procedure Notes (Signed)
Procedure Name: Intubation Date/Time: 10/31/2017 11:29 PM Performed by: Montel Clock, CRNA Pre-anesthesia Checklist: Patient identified, Emergency Drugs available, Suction available, Patient being monitored and Timeout performed Patient Re-evaluated:Patient Re-evaluated prior to induction Oxygen Delivery Method: Circle system utilized Preoxygenation: Pre-oxygenation with 100% oxygen Induction Type: IV induction, Cricoid Pressure applied and Rapid sequence Laryngoscope Size: Mac and 3 Grade View: Grade I Tube type: Oral Tube size: 7.0 mm Number of attempts: 1 Airway Equipment and Method: Stylet Placement Confirmation: ETT inserted through vocal cords under direct vision,  positive ETCO2 and breath sounds checked- equal and bilateral Secured at: 20 cm Tube secured with: Tape Dental Injury: Teeth and Oropharynx as per pre-operative assessment

## 2017-10-31 NOTE — Transfer of Care (Signed)
Immediate Anesthesia Transfer of Care Note  Patient: Christina Everett  Procedure(s) Performed: CYSTOSCOPY WITH STENT REPLACEMENT (Left )  Patient Location: PACU  Anesthesia Type:General  Level of Consciousness: drowsy and patient cooperative  Airway & Oxygen Therapy: Patient Spontanous Breathing and Patient connected to face mask oxygen  Post-op Assessment: Report given to RN and Post -op Vital signs reviewed and stable  Post vital signs: Reviewed and stable  Last Vitals:  Vitals Value Taken Time  BP    Temp    Pulse    Resp    SpO2      Last Pain:  Vitals:   10/31/17 2050  TempSrc: Rectal  PainSc:          Complications: No apparent anesthesia complications

## 2017-10-31 NOTE — ED Notes (Signed)
ED Provider at bedside. 

## 2017-10-31 NOTE — Consult Note (Signed)
PULMONARY / CRITICAL CARE MEDICINE   Name: Christina Everett MRN: [id number] DOB: [birth year]    ADMISSION DATE:  10/31/2017 CONSULTATION DATE:  10/31/2017  REFERRING MD:  Dr. Julian Reil   CHIEF COMPLAINT:  Septic Shock with Obstructive Uropathy   HISTORY OF PRESENT ILLNESS:   62 year old female with PMH of mentally disability. Lives with sister.   Presents to [facility] ED Febrile (102), nausea, vomiting, diarrhea, and leg pain. On arrival BP 103/55. LA 4.25. CT A/P with obstructing stone within left ureter with air within collecting system. Treated with 30 cc/kg fluid bolus, and Zosyn/Vancomycin. Urology consulted. Patient transferred to [facility] for stent vs nephrostomy tube placement. PCCM asked to consult.   At time of my exam of patient in PACU, she is anxious and worried about the planned stent placement. She keeps asking if it will hurt. She denies SOB, CP, Abd pain, N/V during my exam. She is very hot to touch.   PAST MEDICAL HISTORY :  She  has a past medical history of Mentally disabled.  PAST SURGICAL HISTORY: She  has no past surgical history on file.  No Known Allergies  No current facility-administered medications on file prior to encounter.    No current outpatient medications on file prior to encounter.   FAMILY HISTORY:  Her has no family status information on file.   SOCIAL HISTORY: She  reports that she has been smoking.  She uses smokeless tobacco. She reports that she does not drink alcohol or use drugs.  REVIEW OF SYSTEMS:   Review of Systems  Constitutional: Positive for fever.  HENT: Negative.   Eyes: Negative.   Respiratory: Negative.   Cardiovascular: Negative.   Gastrointestinal: Positive for diarrhea, nausea and vomiting.  Genitourinary: Negative.   Musculoskeletal: Negative.   Skin: Negative.   Neurological: Negative.   Endo/Heme/Allergies: Negative.   Psychiatric/Behavioral: Negative.    SUBJECTIVE:  Lying on stretcher in PACU,  Awake/alert  VITAL SIGNS: BP (!) 104/58   Pulse (!) 111   Temp (!) 104 F (40 C) (Rectal)   Resp (!) 37   Ht 5\' 6"  (1.676 m)   Wt 54.4 kg (120 lb)   SpO2 96%   BMI 19.37 kg/m   HEMODYNAMICS:  BP 96/58 on no vasopressors   INTAKE / OUTPUT: No intake/output data recorded.  PHYSICAL EXAMINATION: General: WDWN Adult female, Awake/alert, critically ill due to septic shock Neuro: awake/alert, PERRL, moving all extremities  HEENT: OP clear, MM moist  Cardiovascular: Tachycardic with a regular rhythm, no m/r/g Lungs: CTA b/l, speaking in full sentences, no accessory muscle use Abdomen: Soft NTND, BS hypoactive Musculoskeletal: no LE edema  Skin: no rashes; skin very hot to touch  LABS:  BMET Recent Labs  Lab 10/31/17 1823  NA 137  K 3.3*  CL 103  CO2 21*  BUN 17  CREATININE 1.21*  GLUCOSE 119*   Electrolytes Recent Labs  Lab 10/31/17 1823  CALCIUM 9.4   CBC Recent Labs  Lab 10/31/17 1823  WBC 13.8*  HGB 10.8*  HCT 32.7*  PLT 312   Coag's Recent Labs  Lab 10/31/17 1823  INR 1.04   Sepsis Markers Recent Labs  Lab 10/31/17 1827 10/31/17 2019  LATICACIDVEN 4.25* 5.61*   ABG No results for input(s): PHART, PCO2ART, PO2ART in the last 168 hours.  Liver Enzymes Recent Labs  Lab 10/31/17 1823  AST 36  ALT 12*  ALKPHOS 48  BILITOT 0.8  ALBUMIN 3.4*   Cardiac Enzymes  No results for input(s): TROPONINI, PROBNP in the last 168 hours.  Glucose No results for input(s): GLUCAP in the last 168 hours.  Imaging Dg Chest 2 View  Result Date: 10/31/2017 CLINICAL DATA:  Nausea with vomiting and diarrhea. EXAM: CHEST - 2 VIEW COMPARISON:  None. FINDINGS: Asymmetric masslike opacity identified in the right apex. Lungs are hyperexpanded. No edema or focal airspace consolidation. The cardiopericardial silhouette is within normal limits for size. The visualized bony structures of the thorax are intact. IMPRESSION: Asymmetric masslike density in the right  apex. CT chest recommended to further evaluate. Electronically Signed   By: Kennith Center M.D.   On: 10/31/2017 18:26   Ct Chest W Contrast  Result Date: 10/31/2017 CLINICAL DATA:  Generalized abdominal pain with fever of unknown origin. EXAM: CT CHEST, ABDOMEN AND PELVIS WITHOUT CONTRAST TECHNIQUE: Multidetector CT imaging of the chest, abdomen and pelvis was performed following the standard protocol without IV contrast. COMPARISON:  Chest x-ray from earlier today FINDINGS: CT CHEST FINDINGS Cardiovascular: Heart size upper normal to mildly increased. No pericardial effusion. Coronary artery calcification is evident. Ascending thoracic aorta measures up to 4.3 cm diameter. Mediastinum/Nodes: No mediastinal lymphadenopathy. 3.3 x 1.0 x 2.7 cm homogeneous lesion is identified in the left mediastinum, the level of the AP window. This lesion has an imperceptible wall and approaches water attenuation, most consistent with a cyst. There is no hilar lymphadenopathy. There is no axillary lymphadenopathy. Lungs/Pleura: Biapical pleuroparenchymal scarring is identified. By CT, the findings are symmetric and platelike, most compatible with scarring. The asymmetry suggested on the chest x-ray earlier today is not apparent on CT. 7 mm left upper lobe nodule (image 25/series 4) may also be scarring related. Expiratory imaging of the lung shows subtle mosaic attenuation, likely related to air trapping. No dense focal airspace consolidation. No pleural effusion. Musculoskeletal: Bone windows reveal no worrisome lytic or sclerotic osseous lesions. CT ABDOMEN PELVIS FINDINGS Hepatobiliary: No focal abnormality within the liver parenchyma. There is no evidence for gallstones, gallbladder wall thickening, or pericholecystic fluid. No intrahepatic or extrahepatic biliary dilation. Pancreas: No focal mass lesion. No dilatation of the main duct. No intraparenchymal cyst. No peripancreatic edema. Spleen: No splenomegaly. No focal mass  lesion. Adrenals/Urinary Tract: No adrenal nodule or mass. 2 mm nonobstructing stone identified lower pole right kidney. Right kidney otherwise unremarkable. Right ureter is normal. Moderate to high-grade left hydronephrosis is identified with a 2.1 x 1.4 x 1.3 cm gas containing stone lodged at the UPJ. Decreased perfusion to the left kidney is compatible with obstructive uropathy. Gas bubbles are seen in the dilated left renal pelvis and in the dilated left renal calices. Left ureter distal to the UPJ is nondilated, but multiple gas bubbles are seen in the mid and distal segments. Urinary bladder is moderately distended with intraluminal gas. Stomach/Bowel: Tiny hiatal hernia. Stomach otherwise unremarkable. Duodenum is normally positioned as is the ligament of Treitz. No small bowel wall thickening. No small bowel dilatation. The terminal ileum is normal. The appendix is not visualized, but there is no edema or inflammation in the region of the cecum. Moderate stool volume in the right colon with decompressed transverse and left colon. No obstructing mass lesion evident although motion artifact degrades imaging through the mid transverse segment. Vascular/Lymphatic: There is abdominal aortic atherosclerosis without aneurysm. There is no gastrohepatic or hepatoduodenal ligament lymphadenopathy. No intraperitoneal or retroperitoneal lymphadenopathy. Small left para-aortic lymph nodes are evident. No pelvic sidewall lymphadenopathy. Reproductive: The uterus has normal CT imaging appearance.  There is no adnexal mass. Other: No intraperitoneal free fluid. Musculoskeletal: Bone windows reveal no worrisome lytic or sclerotic osseous lesions. 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 IMPRESSION: 1. Moderate to high-grade left hydroureteronephrosis with decreased perfusion of the left kidney consistent with obstructive uropathy. These changes are related to a 2.1 X  1.4 x 1.3 cm gas containing stone lodged at the UPJ. Additional gas is identified in the dilated renal pelvis and collecting systems of the left kidney as well as along the course of the nondilated left ureter distal to the stone and in the urinary bladder. Imaging features compatible with infection and left pyonephrosis. 2. Symmetric biapical pleuroparenchymal scarring by CT. No evidence for right apical mass as suggested on chest x-ray earlier today. 3. 7 mm left upper lobe pulmonary nodule may be scar. Non-contrast chest CT at 6-12 months is recommended. If the nodule is stable at time of repeat CT, then future CT at 18-24 months (from today's scan) is considered optional for low-risk patients, but is recommended for high-risk patients. This recommendation follows the consensus statement: Guidelines for Management of Incidental Pulmonary Nodules Detected on CT Images: From the Fleischner Society 2017; Radiology 2017; 284:228-243. 4. 4.3 cm diameter ascending thoracic aortic aneurysm. Recommend annual imaging followup by CTA or MRA. This recommendation follows 2010 ACCF/AHA/AATS/ACR/ASA/SCA/SCAI/SIR/STS/SVM Guidelines for the Diagnosis and Management of Patients with Thoracic Aortic Disease. Circulation. 2010; 121: e266-e369 5. 3.3 x 1.0 x 2.7 cm cystic lesion in the left mediastinum. Imaging features most suggestive of pericardial cyst. Attention on follow-up recommended to ensure stability. 6. 2 mm nonobstructing right renal stone. 7.  Aortic Atherosclerois (ICD10-170.0) Electronically Signed   By: Kennith Center M.D.   On: 10/31/2017 20:35   Ct Abdomen Pelvis W Contrast  Result Date: 10/31/2017 CLINICAL DATA:  Generalized abdominal pain with fever of unknown origin. EXAM: CT CHEST, ABDOMEN AND PELVIS WITHOUT CONTRAST TECHNIQUE: Multidetector CT imaging of the chest, abdomen and pelvis was performed following the standard protocol without IV contrast. COMPARISON:  Chest x-ray from earlier today FINDINGS: CT  CHEST FINDINGS Cardiovascular: Heart size upper normal to mildly increased. No pericardial effusion. Coronary artery calcification is evident. Ascending thoracic aorta measures up to 4.3 cm diameter. Mediastinum/Nodes: No mediastinal lymphadenopathy. 3.3 x 1.0 x 2.7 cm homogeneous lesion is identified in the left mediastinum, the level of the AP window. This lesion has an imperceptible wall and approaches water attenuation, most consistent with a cyst. There is no hilar lymphadenopathy. There is no axillary lymphadenopathy. Lungs/Pleura: Biapical pleuroparenchymal scarring is identified. By CT, the findings are symmetric and platelike, most compatible with scarring. The asymmetry suggested on the chest x-ray earlier today is not apparent on CT. 7 mm left upper lobe nodule (image 25/series 4) may also be scarring related. Expiratory imaging of the lung shows subtle mosaic attenuation, likely related to air trapping. No dense focal airspace consolidation. No pleural effusion. Musculoskeletal: Bone windows reveal no worrisome lytic or sclerotic osseous lesions. CT ABDOMEN PELVIS FINDINGS Hepatobiliary: No focal abnormality within the liver parenchyma. There is no evidence for gallstones, gallbladder wall thickening, or pericholecystic fluid. No intrahepatic or extrahepatic biliary dilation. Pancreas: No focal mass lesion. No dilatation of the main duct. No intraparenchymal cyst. No peripancreatic edema. Spleen: No splenomegaly. No focal mass lesion. Adrenals/Urinary Tract: No adrenal nodule or mass. 2 mm nonobstructing stone identified lower pole right kidney. Right kidney otherwise unremarkable. Right ureter is normal. Moderate to high-grade left  hydronephrosis is identified with a 2.1 x 1.4 x 1.3 cm gas containing stone lodged at the UPJ. Decreased perfusion to the left kidney is compatible with obstructive uropathy. Gas bubbles are seen in the dilated left renal pelvis and in the dilated left renal calices. Left  ureter distal to the UPJ is nondilated, but multiple gas bubbles are seen in the mid and distal segments. Urinary bladder is moderately distended with intraluminal gas. Stomach/Bowel: Tiny hiatal hernia. Stomach otherwise unremarkable. Duodenum is normally positioned as is the ligament of Treitz. No small bowel wall thickening. No small bowel dilatation. The terminal ileum is normal. The appendix is not visualized, but there is no edema or inflammation in the region of the cecum. Moderate stool volume in the right colon with decompressed transverse and left colon. No obstructing mass lesion evident although motion artifact degrades imaging through the mid transverse segment. Vascular/Lymphatic: There is abdominal aortic atherosclerosis without aneurysm. There is no gastrohepatic or hepatoduodenal ligament lymphadenopathy. No intraperitoneal or retroperitoneal lymphadenopathy. Small left para-aortic lymph nodes are evident. No pelvic sidewall lymphadenopathy. Reproductive: The uterus has normal CT imaging appearance. There is no adnexal mass. Other: No intraperitoneal free fluid. Musculoskeletal: Bone windows reveal no worrisome lytic or sclerotic osseous lesions. 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 IMPRESSION: 1. Moderate to high-grade left hydroureteronephrosis with decreased perfusion of the left kidney consistent with obstructive uropathy. These changes are related to a 2.1 X 1.4 x 1.3 cm gas containing stone lodged at the UPJ. Additional gas is identified in the dilated renal pelvis and collecting systems of the left kidney as well as along the course of the nondilated left ureter distal to the stone and in the urinary bladder. Imaging features compatible with infection and left pyonephrosis. 2. Symmetric biapical pleuroparenchymal scarring by CT. No evidence for right apical mass as suggested on chest x-ray earlier today. 3. 7 mm left upper lobe  pulmonary nodule may be scar. Non-contrast chest CT at 6-12 months is recommended. If the nodule is stable at time of repeat CT, then future CT at 18-24 months (from today's scan) is considered optional for low-risk patients, but is recommended for high-risk patients. This recommendation follows the consensus statement: Guidelines for Management of Incidental Pulmonary Nodules Detected on CT Images: From the Fleischner Society 2017; Radiology 2017; 284:228-243. 4. 4.3 cm diameter ascending thoracic aortic aneurysm. Recommend annual imaging followup by CTA or MRA. This recommendation follows 2010 ACCF/AHA/AATS/ACR/ASA/SCA/SCAI/SIR/STS/SVM Guidelines for the Diagnosis and Management of Patients with Thoracic Aortic Disease. Circulation. 2010; 121: e266-e369 5. 3.3 x 1.0 x 2.7 cm cystic lesion in the left mediastinum. Imaging features most suggestive of pericardial cyst. Attention on follow-up recommended to ensure stability. 6. 2 mm nonobstructing right renal stone. 7.  Aortic Atherosclerois (ICD10-170.0) Electronically Signed   By: Kennith Center M.D.   On: 10/31/2017 20:35   STUDIES:  CXR 4/7 > Asymmetric masslike opacity identified in the right apex. Lungs are hyperexpanded. No edema or focal airspace consolidation. The cardiopericardial silhouette is within normal limits for size. The visualized bony structures of the thorax are intact. CT C/A/P 4/7 > . Moderate to high-grade left hydroureteronephrosis with decreased perfusion of the left kidney consistent with obstructive uropathy. These changes are related to a 2.1 X 1.4 x 1.3 cm gas containing stone lodged at the UPJ. Additional gas is identified in the dilated renal pelvis and collecting systems of the left kidney as well as along the course  of the nondilated left ureter distal to the stone and in the urinary bladder. Imaging features compatible with infection and left pyonephrosis. 2. Symmetric biapical pleuroparenchymal scarring by CT. No evidence for  right apical mass as suggested on chest x-ray earlier today. 3. 7 mm left upper lobe pulmonary nodule may be scar. Non-contrast chest CT at 6-12 months is recommended. If the nodule is stable at time of repeat CT, then future CT at 18-24 months (from today's scan) is considered optional for low-risk patients, but is recommended for high-risk patients. This recommendation follows the consensus statement: Guidelines for Management of Incidental Pulmonary Nodules Detected on CT Images: From the Fleischner Society 2017; Radiology 2017; 284:228-243. 4. 4.3 cm diameter ascending thoracic aortic aneurysm. Recommend annual imaging followup by CTA or MRA. This recommendation follows 2010 ACCF/AHA/AATS/ACR/ASA/SCA/SCAI/SIR/STS/SVM Guidelines for the Diagnosis and Management of Patients with Thoracic Aortic Disease. Circulation. 2010; 121: [id number] 5. 3.3 x 1.0 x 2.7 cm cystic lesion in the left mediastinum. Imaging features most suggestive of pericardial cyst. Attention on follow-up recommended to ensure stability. 6. 2 mm nonobstructing right renal stone. 7.  Aortic Atherosclerois (ICD10-170.0)  CULTURES: Blood 4/7 >>  UA: ordered  ANTIBIOTICS: Vancomycin 4/7 >  Zosyn 4/7 >   SIGNIFICANT EVENTS: 4/7 > Presents to ED > Transferred to [facility]   LINES/TUBES: PIV  Foley 4/7 >>  DISCUSSION: 62 year old female presents to ED with obstructing urethral stone with associated septic shock. [facility] Ureteral stent [facility].   ASSESSMENT / PLAN:  PULMONARY 1. Respiratory Insufficiency in post-operative setting  - intubated for ureteral stent placement; extubated postop to 2L O2 - continue O2 and wean as tolerated - start incentive spirometry and flutter valve.  2. Pulmonary nodule:  - Chest CT showed 7mm LUL pulmonary nodule; recommended repeat Chest CT in 6-12 months.   CARDIOVASCULAR 1. Ascending Thoracic Aortic Aneurysm: - CT Chest revealed 4.193mm ascending thoracic aortic  aneurysm; recommended is annual follow-up with CTA or MRI.   2. Pericardial Cyst: - CT Chest revealed a 3.3 x 1.0 x 2.7 cm cystic lesion in the left mediastinum, for which follow-up is recommended.   RENAL 1. Acute Kidney Injury; Hypokalemia; Hypomagnesemia - creatinine 1.21 on admit, from unknown baseline; increased to 1.30 on recheck.  - s/p 30cc/kg IV bolus; continue LR @ 100cc/hr - Repleted potassium and magnesium; Trend BMP  - Foley catheter is in place; monitor UOP. Avoid nephrotoxic agents.   GASTROINTESTINAL NPO; GI prophylaxis  HEMATOLOGIC 1. Anemia:  - Hgb 10.8 on admit, down from unknown baseline; on repeat Hgb 9.1 likely from hemodilution. Continue to monitor. Transfuse for Hgb <7  INFECTIOUS 1. Septic shock due to Obstructive Uropathy in setting of  Urethral Stone within Left Proximal Ureter  - s/p ureteral stent placement 4/7 PM by Urology; appreciate rec's - s/p 30cc/kg IVF bolus. Required low dose Neo gtt intraoperatively but weaned off post-op. BP remains soft with MAP 67. May need central line placement. - lactate initially increased from 4.25 to 5.61 despite IVF boluses but now improved to 2.7; continue to trend - procalcitonin 6.87; recheck daily.  - cortisol 36 - follow-up urine and blood cultures -Continue Zosyn and Vancomycin   ENDOCRINE NPO; SSI as needed   NEUROLOGIC 1. History of Mental disability:  - per her family she is "childlike" at baseline. She is alert and talkative but does not have understanding of why she is at the hospital other than that she "feels bad."    FAMILY  -  Updates: Updated patient and her sister (who is her caregiver) in the PACU and again later in the ICU.  - Inter-disciplinary family meet or Palliative Care meeting due by: 11/07/17   60 minutes critical care time  Milana Obey, MD  Pulmonary and Critical Care Medicine [facility] Pager: [phone]  11/01/2017, 3:31 AM

## 2017-10-31 NOTE — Progress Notes (Signed)
Spoke with Dr. Fredderick PhenixBelfi, patient with fever, rising lactate, decreased BP with a large obstructing stone in the left proximal ureter with air within the collecting system.  Plan we formulated was for the [facility] where I can see her and we can assess whether we put a ureteral stent in or place a neph tube.  She will need at least a step down bed and should be admitted to the hospital service.

## 2017-10-31 NOTE — Progress Notes (Signed)
Pharmacy Antibiotic Note  Christina BarthelShirley Everett is a 62 y.o. female admitted on 10/31/2017 with N/V and diarrhea.  Pharmacy has been consulted for Vancomycin and Zosyn dosing.  Pt has been ordered Vancomycin 1gm IV x 1 and Zosyn 3.375gm IV x 1 at [facility].  Plan: Vancomycin 1gm IV x 1 (ordered at [facility]) Vancomycin 750mg  IV q24h Goal trough 15-20 mcg/ml Zosyn 3.375 gm IV q8h (4 hour infusion).   Height: 5\' 6"  (167.6 cm) Weight: 120 lb (54.4 kg) IBW/kg (Calculated) : 59.3  Temp (24hrs), Avg:101.6 F (38.7 C), Min:101.6 F (38.7 C), Max:101.6 F (38.7 C)  Recent Labs  Lab 10/31/17 1823 10/31/17 1827  WBC 13.8*  --   CREATININE 1.21*  --   LATICACIDVEN  --  4.25*    Estimated Creatinine Clearance: 40.9 mL/min (A) (by C-G formula based on SCr of 1.21 mg/dL (H)).    No Known Allergies  Antimicrobials this admission: Vanc 4/7 >> Zosyn 4/7 >>  Dose adjustments this admission:   Microbiology results: 4/7  BCx: pending   Thank you for allowing pharmacy to be a part of this patient's care.  Toys 'R' UsKimberly Trinka Keshishyan, Pharm.D., BCPS Clinical Pharmacist Pager: [phone] 10/31/2017 7:02 PM

## 2017-10-31 NOTE — ED Notes (Addendum)
ED Provider at bedside.  Follow up explanation given to family after MD explained that patient will be [facility].  Patient stated that she can sign the consent.   Family at bedside when patient signed the consent.

## 2017-11-01 ENCOUNTER — Encounter (HOSPITAL_COMMUNITY): Payer: Self-pay | Admitting: Urology

## 2017-11-01 ENCOUNTER — Inpatient Hospital Stay (HOSPITAL_COMMUNITY): Payer: Medicare Other

## 2017-11-01 DIAGNOSIS — R6521 Severe sepsis with septic shock: Secondary | ICD-10-CM

## 2017-11-01 DIAGNOSIS — A419 Sepsis, unspecified organism: Principal | ICD-10-CM

## 2017-11-01 DIAGNOSIS — R911 Solitary pulmonary nodule: Secondary | ICD-10-CM

## 2017-11-01 DIAGNOSIS — I712 Thoracic aortic aneurysm, without rupture: Secondary | ICD-10-CM

## 2017-11-01 DIAGNOSIS — N200 Calculus of kidney: Secondary | ICD-10-CM

## 2017-11-01 DIAGNOSIS — N179 Acute kidney failure, unspecified: Secondary | ICD-10-CM

## 2017-11-01 DIAGNOSIS — N139 Obstructive and reflux uropathy, unspecified: Secondary | ICD-10-CM

## 2017-11-01 DIAGNOSIS — N136 Pyonephrosis: Secondary | ICD-10-CM

## 2017-11-01 LAB — COMPREHENSIVE METABOLIC PANEL
ALK PHOS: 45 U/L (ref 38–126)
ALT: 13 U/L — ABNORMAL LOW (ref 14–54)
ANION GAP: 11 (ref 5–15)
AST: 24 U/L (ref 15–41)
Albumin: 2.7 g/dL — ABNORMAL LOW (ref 3.5–5.0)
BUN: 14 mg/dL (ref 6–20)
CALCIUM: 8.3 mg/dL — AB (ref 8.9–10.3)
CO2: 19 mmol/L — ABNORMAL LOW (ref 22–32)
Chloride: 109 mmol/L (ref 101–111)
Creatinine, Ser: 1.3 mg/dL — ABNORMAL HIGH (ref 0.44–1.00)
GFR calc non Af Amer: 43 mL/min — ABNORMAL LOW (ref >60)
GFR, EST AFRICAN AMERICAN: 50 mL/min — AB (ref >60)
Glucose, Bld: 109 mg/dL — ABNORMAL HIGH (ref 65–99)
Potassium: 3.4 mmol/L — ABNORMAL LOW (ref 3.5–5.1)
Sodium: 139 mmol/L (ref 135–145)
TOTAL PROTEIN: 5.6 g/dL — AB (ref 6.5–8.1)
Total Bilirubin: 0.5 mg/dL (ref 0.3–1.2)

## 2017-11-01 LAB — URINALYSIS, ROUTINE W REFLEX MICROSCOPIC
Bilirubin Urine: NEGATIVE
GLUCOSE, UA: NEGATIVE mg/dL
KETONES UR: NEGATIVE mg/dL
Nitrite: NEGATIVE
PROTEIN: NEGATIVE mg/dL
SQUAMOUS EPITHELIAL / LPF: NONE SEEN
Specific Gravity, Urine: 1.006 (ref 1.005–1.030)
pH: 7 (ref 5.0–8.0)

## 2017-11-01 LAB — POCT I-STAT 3, VENOUS BLOOD GAS (G3P V)
ACID-BASE DEFICIT: 4 mmol/L — AB (ref 0.0–2.0)
BICARBONATE: 20.9 mmol/L (ref 20.0–28.0)
O2 SAT: 54 %
PO2 VEN: 30 mmHg — AB (ref 32.0–45.0)
TCO2: 22 mmol/L (ref 22–32)
pCO2, Ven: 37.2 mmHg — ABNORMAL LOW (ref 44.0–60.0)
pH, Ven: 7.357 (ref 7.250–7.430)

## 2017-11-01 LAB — GLUCOSE, CAPILLARY
GLUCOSE-CAPILLARY: 116 mg/dL — AB (ref 65–99)
GLUCOSE-CAPILLARY: 101 mg/dL — AB (ref 65–99)
GLUCOSE-CAPILLARY: 174 mg/dL — AB (ref 65–99)
GLUCOSE-CAPILLARY: 116 mg/dL — AB (ref 65–99)
Glucose-Capillary: 139 mg/dL — ABNORMAL HIGH (ref 65–99)
Glucose-Capillary: 122 mg/dL — ABNORMAL HIGH (ref 65–99)

## 2017-11-01 LAB — BLOOD GAS, VENOUS

## 2017-11-01 LAB — LACTIC ACID, PLASMA
LACTIC ACID, VENOUS: 2.1 mmol/L — AB (ref 0.5–1.9)
Lactic Acid, Venous: 2.7 mmol/L (ref 0.5–1.9)
Lactic Acid, Venous: 2.3 mmol/L (ref 0.5–1.9)
Lactic Acid, Venous: 2.3 mmol/L (ref 0.5–1.9)

## 2017-11-01 LAB — CBC WITH DIFFERENTIAL/PLATELET
BASOS ABS: 0 10*3/uL (ref 0.0–0.1)
Basophils Relative: 0 %
EOS PCT: 0 %
Eosinophils Absolute: 0 10*3/uL (ref 0.0–0.7)
HEMATOCRIT: 29.1 % — AB (ref 36.0–46.0)
HEMOGLOBIN: 9.1 g/dL — AB (ref 12.0–15.0)
LYMPHS ABS: 1.1 10*3/uL (ref 0.7–4.0)
Lymphocytes Relative: 4 %
MCH: 26.9 pg (ref 26.0–34.0)
MCHC: 31.3 g/dL (ref 30.0–36.0)
MCV: 86.1 fL (ref 78.0–100.0)
MONOS PCT: 5 %
Monocytes Absolute: 1.4 10*3/uL — ABNORMAL HIGH (ref 0.1–1.0)
NEUTROS ABS: 25.8 10*3/uL — AB (ref 1.7–7.7)
Neutrophils Relative %: 91 %
Platelets: 232 10*3/uL (ref 150–400)
RBC: 3.38 MIL/uL — ABNORMAL LOW (ref 3.87–5.11)
RDW: 14.2 % (ref 11.5–15.5)
WBC: 28.3 10*3/uL — ABNORMAL HIGH (ref 4.0–10.5)

## 2017-11-01 LAB — PROCALCITONIN: Procalcitonin: 68.7 ng/mL

## 2017-11-01 LAB — MRSA PCR SCREENING: MRSA by PCR: NEGATIVE

## 2017-11-01 LAB — MAGNESIUM: Magnesium: 1.3 mg/dL — ABNORMAL LOW (ref 1.7–2.4)

## 2017-11-01 LAB — PHOSPHORUS: Phosphorus: 2.9 mg/dL (ref 2.5–4.6)

## 2017-11-01 LAB — CORTISOL: Cortisol, Plasma: 36.3 ug/dL

## 2017-11-01 LAB — APTT: APTT: 30 s (ref 24–36)

## 2017-11-01 LAB — TROPONIN I: Troponin I: <0.03 ng/mL (ref <0.03)

## 2017-11-01 MED ORDER — POTASSIUM CHLORIDE 10 MEQ/100ML IV SOLN
10.0000 meq | INTRAVENOUS | Status: AC
  Administered 2017-11-01 (×4): 10 meq via INTRAVENOUS
  Filled 2017-11-01 (×4): qty 100

## 2017-11-01 MED ORDER — LACTATED RINGERS IV BOLUS
500.0000 mL | Freq: Once | INTRAVENOUS | Status: AC
  Administered 2017-11-01: 500 mL via INTRAVENOUS

## 2017-11-01 MED ORDER — MEPERIDINE HCL 50 MG/ML IJ SOLN: 6.2500 mg | INTRAMUSCULAR | Status: DC | PRN

## 2017-11-01 MED ORDER — NOREPINEPHRINE BITARTRATE 1 MG/ML IV SOLN
0.0000 ug/min | INTRAVENOUS | Status: DC
  Administered 2017-11-01: 2 ug/min via INTRAVENOUS
  Filled 2017-11-01: qty 4

## 2017-11-01 MED ORDER — HYDROMORPHONE HCL 1 MG/ML IJ SOLN: 0.2500 mg | INTRAMUSCULAR | Status: DC | PRN

## 2017-11-01 MED ORDER — CHLORHEXIDINE GLUCONATE 0.12 % MT SOLN
15.0000 mL | Freq: Two times a day (BID) | OROMUCOSAL | Status: DC
  Administered 2017-11-01 – 2017-11-02 (×3): 15 mL via OROMUCOSAL
  Filled 2017-11-01: qty 15

## 2017-11-01 MED ORDER — METOCLOPRAMIDE HCL 5 MG/ML IJ SOLN: 10.0000 mg | Freq: Once | INTRAMUSCULAR | Status: DC | PRN

## 2017-11-01 MED ORDER — ORAL CARE MOUTH RINSE
15.0000 mL | Freq: Two times a day (BID) | OROMUCOSAL | Status: DC
  Administered 2017-11-02: 15 mL via OROMUCOSAL

## 2017-11-01 MED ORDER — MAGNESIUM SULFATE 4 GM/100ML IV SOLN
4.0000 g | Freq: Once | INTRAVENOUS | Status: AC
  Administered 2017-11-01: 4 g via INTRAVENOUS
  Filled 2017-11-01: qty 100

## 2017-11-01 NOTE — Progress Notes (Signed)
Called PACU/OR and spoke to Clydie BraunKaren, Charity fundraiserN.  This RT unable to obtain abg due to pt being prepped for surgery.

## 2017-11-01 NOTE — Discharge Instructions (Signed)
DISCHARGE INSTRUCTIONS FOR KIDNEY STONE/URETERAL STENT   MEDICATIONS:  1.  Resume all your other meds from home - except do not take any extra narcotic pain meds that you may have at home.    ACTIVITY:  1. No strenuous activity x 1week  2. No driving while on narcotic pain medications  3. Drink plenty of water  4. Continue to walk at home - you can still get blood clots when you are at home, so keep active, but don't over do it.  5. May return to work/school tomorrow or when you feel ready   BATHING:  1. You can shower and we recommend daily showers   SIGNS/SYMPTOMS TO CALL:  Please call us if you have a fever greater than 101.5, uncontrolled nausea/vomiting, uncontrolled pain, dizziness, unable to urinate, bloody urine, chest pain, shortness of breath, leg swelling, leg pain, redness around wound, drainage from wound, or any other concerns or questions.   You can reach us at [phone].   FOLLOW-UP:  1.  We will schedule you to have a second operation for removal of the kidney stone and sure follow-up appointment.  This will be within the next 2-week.  Someone from our office will call you.

## 2017-11-01 NOTE — OR Nursing (Signed)
Dr. Malen GauzeFoster at bedside, pt okay [facility].

## 2017-11-01 NOTE — Plan of Care (Signed)

## 2017-11-01 NOTE — Progress Notes (Signed)
PULMONARY / CRITICAL CARE MEDICINE   Name: Christina Everett MRN: [id number] DOB: 1956/01/31    ADMISSION DATE:  10/31/2017 CONSULTATION DATE:  10/31/2017  REFERRING MD:  Dr. Julian Reil   CHIEF COMPLAINT:  Septic Shock with Obstructive Uropathy   HISTORY OF PRESENT ILLNESS:   62 year old female with PMH of mentally disability. Lives with sister.   Presents to [facility] ED Febrile (102), nausea, vomiting, diarrhea, and leg pain. On arrival BP 103/55. LA 4.25. CT A/P with obstructing stone within left ureter with air within collecting system. Treated with 30 cc/kg fluid bolus, and Zosyn/Vancomycin. Urology consulted. Patient transferred to [facility] for stent vs nephrostomy tube placement. PCCM asked to consult.   At time of my exam of patient in PACU, she is anxious and worried about the planned stent placement. She keeps asking if it will hurt. She denies SOB, CP, Abd pain, N/V during my exam. She is very hot to touch.   SUBJECTIVE:  No events overnight. Lying in bed in no distress. Complaints of being cold and thirsty. Denies any pain, shortness of breath or otherwise.   VITAL SIGNS: BP 99/61   Pulse 71   Temp 98.1 F (36.7 C) (Axillary)   Resp 18   Ht 5\' 6"  (1.676 m)   Wt 121 lb 0.5 oz (54.9 kg)   SpO2 98%   BMI 19.54 kg/m   HEMODYNAMICS:    VENTILATOR SETTINGS:    INTAKE / OUTPUT: I/O last 3 completed shifts: In: 5077.3 [I.V.:1521; IV Piggyback:3556.3] Out: 1950 [Urine:1950]  PHYSICAL EXAMINATION: General: alert, well-developed, and cooperative to examination, lying in bed in no acute distress HEENT: OP clear, MM moist Lungs: normal respiratory effort, no accessory muscle use, normal breath sounds, no crackles, and no wheezes. Heart: normal rate, regular rhythm, no murmur, no gallop, and no rub.  Abdomen: soft, non-tender, normal bowel sounds, no distention Msk: no LE edema Pulses: 2+ DP/PT pulses bilaterally Neurologic: awake/alert, moving all extremities Skin:  turgor normal and no rashes.   LABS:  BMET Recent Labs  Lab 10/31/17 1823 11/01/17 0126  NA 137 139  K 3.3* 3.4*  CL 103 109  CO2 21* 19*  BUN 17 14  CREATININE 1.21* 1.30*  GLUCOSE 119* 109*    Electrolytes Recent Labs  Lab 10/31/17 1823 11/01/17 0126  CALCIUM 9.4 8.3*  MG  --  1.3*  PHOS  --  2.9    CBC Recent Labs  Lab 10/31/17 1823 11/01/17 0126  WBC 13.8* 28.3*  HGB 10.8* 9.1*  HCT 32.7* 29.1*  PLT 312 232    Coag's Recent Labs  Lab 10/31/17 1823 11/01/17 0126  APTT  --  30  INR 1.04  --     Sepsis Markers Recent Labs  Lab 10/31/17 2019 11/01/17 0126 11/01/17 0405  LATICACIDVEN 5.61* 2.7* 2.1*  PROCALCITON  --  68.70  --     ABG No results for input(s): PHART, PCO2ART, PO2ART in the last 168 hours.  Liver Enzymes Recent Labs  Lab 10/31/17 1823 11/01/17 0126  AST 36 24  ALT 12* 13*  ALKPHOS 48 45  BILITOT 0.8 0.5  ALBUMIN 3.4* 2.7*    Cardiac Enzymes Recent Labs  Lab 11/01/17 0126  TROPONINI <0.03    Glucose Recent Labs  Lab 11/01/17 0157 11/01/17 0406  GLUCAP 116* 101*    Imaging Dg Chest 2 View  Result Date: 10/31/2017 CLINICAL DATA:  Nausea with vomiting and diarrhea. EXAM: CHEST - 2 VIEW COMPARISON:  None. FINDINGS: Asymmetric masslike opacity identified in the right apex. Lungs are hyperexpanded. No edema or focal airspace consolidation. The cardiopericardial silhouette is within normal limits for size. The visualized bony structures of the thorax are intact. IMPRESSION: Asymmetric masslike density in the right apex. CT chest recommended to further evaluate. Electronically Signed   By: Kennith Center M.D.   On: 10/31/2017 18:26   Ct Chest W Contrast  Result Date: 10/31/2017 CLINICAL DATA:  Generalized abdominal pain with fever of unknown origin. EXAM: CT CHEST, ABDOMEN AND PELVIS WITHOUT CONTRAST TECHNIQUE: Multidetector CT imaging of the chest, abdomen and pelvis was performed following the standard protocol without  IV contrast. COMPARISON:  Chest x-ray from earlier today FINDINGS: CT CHEST FINDINGS Cardiovascular: Heart size upper normal to mildly increased. No pericardial effusion. Coronary artery calcification is evident. Ascending thoracic aorta measures up to 4.3 cm diameter. Mediastinum/Nodes: No mediastinal lymphadenopathy. 3.3 x 1.0 x 2.7 cm homogeneous lesion is identified in the left mediastinum, the level of the AP window. This lesion has an imperceptible wall and approaches water attenuation, most consistent with a cyst. There is no hilar lymphadenopathy. There is no axillary lymphadenopathy. Lungs/Pleura: Biapical pleuroparenchymal scarring is identified. By CT, the findings are symmetric and platelike, most compatible with scarring. The asymmetry suggested on the chest x-ray earlier today is not apparent on CT. 7 mm left upper lobe nodule (image 25/series 4) may also be scarring related. Expiratory imaging of the lung shows subtle mosaic attenuation, likely related to air trapping. No dense focal airspace consolidation. No pleural effusion. Musculoskeletal: Bone windows reveal no worrisome lytic or sclerotic osseous lesions. CT ABDOMEN PELVIS FINDINGS Hepatobiliary: No focal abnormality within the liver parenchyma. There is no evidence for gallstones, gallbladder wall thickening, or pericholecystic fluid. No intrahepatic or extrahepatic biliary dilation. Pancreas: No focal mass lesion. No dilatation of the main duct. No intraparenchymal cyst. No peripancreatic edema. Spleen: No splenomegaly. No focal mass lesion. Adrenals/Urinary Tract: No adrenal nodule or mass. 2 mm nonobstructing stone identified lower pole right kidney. Right kidney otherwise unremarkable. Right ureter is normal. Moderate to high-grade left hydronephrosis is identified with a 2.1 x 1.4 x 1.3 cm gas containing stone lodged at the UPJ. Decreased perfusion to the left kidney is compatible with obstructive uropathy. Gas bubbles are seen in the  dilated left renal pelvis and in the dilated left renal calices. Left ureter distal to the UPJ is nondilated, but multiple gas bubbles are seen in the mid and distal segments. Urinary bladder is moderately distended with intraluminal gas. Stomach/Bowel: Tiny hiatal hernia. Stomach otherwise unremarkable. Duodenum is normally positioned as is the ligament of Treitz. No small bowel wall thickening. No small bowel dilatation. The terminal ileum is normal. The appendix is not visualized, but there is no edema or inflammation in the region of the cecum. Moderate stool volume in the right colon with decompressed transverse and left colon. No obstructing mass lesion evident although motion artifact degrades imaging through the mid transverse segment. Vascular/Lymphatic: There is abdominal aortic atherosclerosis without aneurysm. There is no gastrohepatic or hepatoduodenal ligament lymphadenopathy. No intraperitoneal or retroperitoneal lymphadenopathy. Small left para-aortic lymph nodes are evident. No pelvic sidewall lymphadenopathy. Reproductive: The uterus has normal CT imaging appearance. There is no adnexal mass. Other: No intraperitoneal free fluid. Musculoskeletal: Bone windows reveal no worrisome lytic or sclerotic osseous lesions. 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 IMPRESSION: 1. Moderate to high-grade left hydroureteronephrosis with decreased perfusion of  the left kidney consistent with obstructive uropathy. These changes are related to a 2.1 X 1.4 x 1.3 cm gas containing stone lodged at the UPJ. Additional gas is identified in the dilated renal pelvis and collecting systems of the left kidney as well as along the course of the nondilated left ureter distal to the stone and in the urinary bladder. Imaging features compatible with infection and left pyonephrosis. 2. Symmetric biapical pleuroparenchymal scarring by CT. No evidence for right apical mass  as suggested on chest x-ray earlier today. 3. 7 mm left upper lobe pulmonary nodule may be scar. Non-contrast chest CT at 6-12 months is recommended. If the nodule is stable at time of repeat CT, then future CT at 18-24 months (from today's scan) is considered optional for low-risk patients, but is recommended for high-risk patients. This recommendation follows the consensus statement: Guidelines for Management of Incidental Pulmonary Nodules Detected on CT Images: From the Fleischner Society 2017; Radiology 2017; 284:228-243. 4. 4.3 cm diameter ascending thoracic aortic aneurysm. Recommend annual imaging followup by CTA or MRA. This recommendation follows 2010 ACCF/AHA/AATS/ACR/ASA/SCA/SCAI/SIR/STS/SVM Guidelines for the Diagnosis and Management of Patients with Thoracic Aortic Disease. Circulation. 2010; 121: e266-e369 5. 3.3 x 1.0 x 2.7 cm cystic lesion in the left mediastinum. Imaging features most suggestive of pericardial cyst. Attention on follow-up recommended to ensure stability. 6. 2 mm nonobstructing right renal stone. 7.  Aortic Atherosclerois (ICD10-170.0) Electronically Signed   By: Kennith Center M.D.   On: 10/31/2017 20:35   Ct Abdomen Pelvis W Contrast  Result Date: 10/31/2017 CLINICAL DATA:  Generalized abdominal pain with fever of unknown origin. EXAM: CT CHEST, ABDOMEN AND PELVIS WITHOUT CONTRAST TECHNIQUE: Multidetector CT imaging of the chest, abdomen and pelvis was performed following the standard protocol without IV contrast. COMPARISON:  Chest x-ray from earlier today FINDINGS: CT CHEST FINDINGS Cardiovascular: Heart size upper normal to mildly increased. No pericardial effusion. Coronary artery calcification is evident. Ascending thoracic aorta measures up to 4.3 cm diameter. Mediastinum/Nodes: No mediastinal lymphadenopathy. 3.3 x 1.0 x 2.7 cm homogeneous lesion is identified in the left mediastinum, the level of the AP window. This lesion has an imperceptible wall and approaches water  attenuation, most consistent with a cyst. There is no hilar lymphadenopathy. There is no axillary lymphadenopathy. Lungs/Pleura: Biapical pleuroparenchymal scarring is identified. By CT, the findings are symmetric and platelike, most compatible with scarring. The asymmetry suggested on the chest x-ray earlier today is not apparent on CT. 7 mm left upper lobe nodule (image 25/series 4) may also be scarring related. Expiratory imaging of the lung shows subtle mosaic attenuation, likely related to air trapping. No dense focal airspace consolidation. No pleural effusion. Musculoskeletal: Bone windows reveal no worrisome lytic or sclerotic osseous lesions. CT ABDOMEN PELVIS FINDINGS Hepatobiliary: No focal abnormality within the liver parenchyma. There is no evidence for gallstones, gallbladder wall thickening, or pericholecystic fluid. No intrahepatic or extrahepatic biliary dilation. Pancreas: No focal mass lesion. No dilatation of the main duct. No intraparenchymal cyst. No peripancreatic edema. Spleen: No splenomegaly. No focal mass lesion. Adrenals/Urinary Tract: No adrenal nodule or mass. 2 mm nonobstructing stone identified lower pole right kidney. Right kidney otherwise unremarkable. Right ureter is normal. Moderate to high-grade left hydronephrosis is identified with a 2.1 x 1.4 x 1.3 cm gas containing stone lodged at the UPJ. Decreased perfusion to the left kidney is compatible with obstructive uropathy. Gas bubbles are seen in the dilated left renal pelvis and in the dilated left renal calices. Left ureter  distal to the UPJ is nondilated, but multiple gas bubbles are seen in the mid and distal segments. Urinary bladder is moderately distended with intraluminal gas. Stomach/Bowel: Tiny hiatal hernia. Stomach otherwise unremarkable. Duodenum is normally positioned as is the ligament of Treitz. No small bowel wall thickening. No small bowel dilatation. The terminal ileum is normal. The appendix is not visualized,  but there is no edema or inflammation in the region of the cecum. Moderate stool volume in the right colon with decompressed transverse and left colon. No obstructing mass lesion evident although motion artifact degrades imaging through the mid transverse segment. Vascular/Lymphatic: There is abdominal aortic atherosclerosis without aneurysm. There is no gastrohepatic or hepatoduodenal ligament lymphadenopathy. No intraperitoneal or retroperitoneal lymphadenopathy. Small left para-aortic lymph nodes are evident. No pelvic sidewall lymphadenopathy. Reproductive: The uterus has normal CT imaging appearance. There is no adnexal mass. Other: No intraperitoneal free fluid. Musculoskeletal: Bone windows reveal no worrisome lytic or sclerotic osseous lesions. 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 IMPRESSION: 1. Moderate to high-grade left hydroureteronephrosis with decreased perfusion of the left kidney consistent with obstructive uropathy. These changes are related to a 2.1 X 1.4 x 1.3 cm gas containing stone lodged at the UPJ. Additional gas is identified in the dilated renal pelvis and collecting systems of the left kidney as well as along the course of the nondilated left ureter distal to the stone and in the urinary bladder. Imaging features compatible with infection and left pyonephrosis. 2. Symmetric biapical pleuroparenchymal scarring by CT. No evidence for right apical mass as suggested on chest x-ray earlier today. 3. 7 mm left upper lobe pulmonary nodule may be scar. Non-contrast chest CT at 6-12 months is recommended. If the nodule is stable at time of repeat CT, then future CT at 18-24 months (from today's scan) is considered optional for low-risk patients, but is recommended for high-risk patients. This recommendation follows the consensus statement: Guidelines for Management of Incidental Pulmonary Nodules Detected on CT Images: From the Fleischner  Society 2017; Radiology 2017; 284:228-243. 4. 4.3 cm diameter ascending thoracic aortic aneurysm. Recommend annual imaging followup by CTA or MRA. This recommendation follows 2010 ACCF/AHA/AATS/ACR/ASA/SCA/SCAI/SIR/STS/SVM Guidelines for the Diagnosis and Management of Patients with Thoracic Aortic Disease. Circulation. 2010; 121: e266-e369 5. 3.3 x 1.0 x 2.7 cm cystic lesion in the left mediastinum. Imaging features most suggestive of pericardial cyst. Attention on follow-up recommended to ensure stability. 6. 2 mm nonobstructing right renal stone. 7.  Aortic Atherosclerois (ICD10-170.0) Electronically Signed   By: Kennith Center M.D.   On: 10/31/2017 20:35   Dg C-arm 1-60 Min-no Report  Result Date: 10/31/2017 Fluoroscopy was utilized by the requesting physician.  No radiographic interpretation.    STUDIES:  CXR 4/7 > Asymmetric masslike opacity identified in the right apex. Lungs are hyperexpanded. No edema or focal airspace consolidation. The cardiopericardial silhouette is within normal limits for size. The visualized bony structures of the thorax are intact. CT C/A/P 4/7 > . Moderate to high-grade left hydroureteronephrosis with decreased perfusion of the left kidney consistent with obstructive uropathy. These changes are related to a 2.1 X 1.4 x 1.3 cm gas containing stone lodged at the UPJ. Additional gas is identified in the dilated renal pelvis and collecting systems of the left kidney as well as along the course of the nondilated left ureter distal to the stone and in the urinary bladder. Imaging features compatible with infection and left pyonephrosis. 2. Symmetric biapical  pleuroparenchymal scarring by CT. No evidence for right apical mass as suggested on chest x-ray earlier today. 3. 7 mm left upper lobe pulmonary nodule may be scar. Non-contrast chest CT at 6-12 months is recommended. If the nodule is stable at time of repeat CT, then future CT at 18-24 months (from today's scan) is  considered optional for low-risk patients, but is recommended for high-risk patients. This recommendation follows the consensus statement: Guidelines for Management of Incidental Pulmonary Nodules Detected on CT Images: From the Fleischner Society 2017; Radiology 2017; 284:228-243. 4. 4.3 cm diameter ascending thoracic aortic aneurysm. Recommend annual imaging followup by CTA or MRA. This recommendation follows 2010 ACCF/AHA/AATS/ACR/ASA/SCA/SCAI/SIR/STS/SVM Guidelines for the Diagnosis and Management of Patients with Thoracic Aortic Disease. Circulation. 2010; 121: [id number] 5. 3.3 x 1.0 x 2.7 cm cystic lesion in the left mediastinum. Imaging features most suggestive of pericardial cyst. Attention on follow-up recommended to ensure stability. 6. 2 mm nonobstructing right renal stone. 7. Aortic Atherosclerois (ICD10-170.0)  CULTURES: Blood 4/7 >>  Urine 4/7 >>  ANTIBIOTICS: Vancomycin 4/7 >  Zosyn 4/7 >   SIGNIFICANT EVENTS: 4/7 > Presents to ED > Transferred to [facility] > Cystoscopy with left ureteral stent placement and left retrograde pyelography > hypotensive requiring neo following procedure > no ICU beds at [facility] and transferred to [facility]  LINES/TUBES: PIV  Foley 4/7 >>  DISCUSSION: 62 year old female presents to ED with obstructing urethral stone with associated septic shock. Transferred to [facility] for Ureteral stent placement. Hypotensive after procedure requiring low dose neo and transferred back to [facility] for ICU bed.     PULMONARY 1. Respiratory Insufficiency in post-operative setting Intubated for ureteral stent placement; extubated postop to 2L O2 and weaned to room air overnight. Doing well on RA.  P: Incentive spirometry and flutter valve.  2. Pulmonary nodule:  Chest CT showed 7mm LUL pulmonary nodule  P: recommended repeat Chest CT in 6-12 months.   CARDIOVASCULAR 1. Ascending Thoracic Aortic Aneurysm: CT Chest revealed 4.52mm ascending thoracic aortic aneurysm  P:  recommended is annual follow-up with CTA or MRI.   2. Pericardial Cyst: CT Chest revealed a 3.3 x 1.0 x 2.7 cm cystic lesion in the left mediastinum  P: follow-up is recommended.   RENAL 1. Acute Kidney Injury; Hypokalemia; Hypomagnesemia Creatinine 1.21 on admit, from unknown baseline; increased to 1.30 on recheck. s/p 30cc/kg IV bolus.   P:  Continue LR @ 100 cc/hr Repeat BMP and trend Replete electrolytes  Foley catheter is in place; monitor UOP. Avoid nephrotoxic agents.   GASTROINTESTINAL NPO; GI prophylaxis  HEMATOLOGIC 1. Anemia:  Hgb 10.8 on admit, down from unknown baseline; on repeat Hgb 9.1 likely from hemodilution.   P: Continue to monitor. Transfuse for Hgb <7  INFECTIOUS Septic shock due to Obstructive Uropathy in setting of Urethral Stone within Left Proximal Ureter  s/p ureteral stent placement 4/7 PM by Urology. s/p 30cc/kg IVF bolus. Required low dose Neo gtt intraoperatively and postoperatively. Transferred to [facility] for ICU bed. Lactate initially increased from 4.25 to 5.61 despite IVF boluses but now improved to 2.1, PCT 68.7. Cortisol 36  P: Wean pressors as able; MAP > 65 Continue LR @ 100 cc/hr Procalcitonin; recheck daily.  Follow-up urine and blood cultures Continue Zosyn and Vancomycin   ENDOCRINE Re-start diet; SSI as needed   NEUROLOGIC 1. History of Mental disability:  Per her family she is "childlike" at baseline. She is alert and talkative but does not have understanding of why she is  at the hospital other than that she "feels bad."    FAMILY  - Updates: No family at bedside this morning.Her sister is her primary caregiver.  - Inter-disciplinary family meet or Palliative Care meeting due by: 11/07/17   Pulmonary and Critical Care Medicine [facility] Pager: [phone]  11/01/2017, 7:20 AM

## 2017-11-01 NOTE — Progress Notes (Signed)
PCCM Interval Note  Patient with continued hypotension (systolic 80s). Fluid Responsive. Given 500 ml LR at 0246 and another at 0611 (total of 2.75 L since admission) with improvement of BP and decreasing lactic acid (5.6 > 2.7 > 2.1). Currently on 3 mcg/min Levophed.    Jovita KussmaulKatalina Burt Piatek, AGACNP-BC [facility]  Pgr: [phone]  PCCM Pgr: [phone]

## 2017-11-01 NOTE — Progress Notes (Signed)
CRITICAL VALUE ALERT  Critical Value:   - Lactic Acid @ 0130 - 2.7 - Lactic Acid @ 0405 - 2.1  Provider Notified: Jovita KussmaulKatalina Eubanks, NP  Francia GreavesSavannah R Jahmel Flannagan, RN

## 2017-11-01 NOTE — Anesthesia Postprocedure Evaluation (Signed)
Anesthesia Post Note  Patient: Christina BarthelShirley Everett  Procedure(s) Performed: CYSTOSCOPY WITH STENT REPLACEMENT (Left )     Patient location during evaluation: PACU Anesthesia Type: General Level of consciousness: awake and alert Pain management: pain level controlled Vital Signs Assessment: post-procedure vital signs reviewed and stable Respiratory status: spontaneous breathing, nonlabored ventilation, respiratory function stable and patient connected to nasal cannula oxygen Cardiovascular status: blood pressure returned to baseline and stable Postop Assessment: no apparent nausea or vomiting Anesthetic complications: no Comments: [facility]    Last Vitals:  Vitals:   10/31/17 2345 11/01/17 0015  BP: (!) 85/53 (!) 91/52  Pulse: (!) 102 97  Resp: (!) 29   Temp: 37.4 C   SpO2: 100% 100%    Last Pain:  Vitals:   10/31/17 2345  TempSrc:   PainSc: 0-No pain                 Anuar Walgren A.

## 2017-11-01 NOTE — Progress Notes (Signed)
Afternoon lactic acid stayed at 2.3.   -continue fluids.   Lorenso CourierVahini Calene Paradiso, MD Internal Medicine PGY1 Pager:[phone] 11/01/2017, 4:32 PM

## 2017-11-02 DIAGNOSIS — R6520 Severe sepsis without septic shock: Secondary | ICD-10-CM

## 2017-11-02 LAB — CBC
HCT: 28.2 % — ABNORMAL LOW (ref 36.0–46.0)
Hemoglobin: 9.2 g/dL — ABNORMAL LOW (ref 12.0–15.0)
MCH: 28.3 pg (ref 26.0–34.0)
MCHC: 32.6 g/dL (ref 30.0–36.0)
MCV: 86.8 fL (ref 78.0–100.0)
PLATELETS: 237 10*3/uL (ref 150–400)
RBC: 3.25 MIL/uL — ABNORMAL LOW (ref 3.87–5.11)
RDW: 14.9 % (ref 11.5–15.5)
WBC: 36.1 10*3/uL — ABNORMAL HIGH (ref 4.0–10.5)

## 2017-11-02 LAB — GLUCOSE, CAPILLARY
GLUCOSE-CAPILLARY: 120 mg/dL — AB (ref 65–99)
Glucose-Capillary: 100 mg/dL — ABNORMAL HIGH (ref 65–99)
Glucose-Capillary: 99 mg/dL (ref 65–99)
Glucose-Capillary: 97 mg/dL (ref 65–99)
Glucose-Capillary: 124 mg/dL — ABNORMAL HIGH (ref 65–99)

## 2017-11-02 LAB — BASIC METABOLIC PANEL
Anion gap: 8 (ref 5–15)
BUN: 16 mg/dL (ref 6–20)
CALCIUM: 8.1 mg/dL — AB (ref 8.9–10.3)
CHLORIDE: 110 mmol/L (ref 101–111)
CO2: 22 mmol/L (ref 22–32)
CREATININE: 0.98 mg/dL (ref 0.44–1.00)
GFR calc Af Amer: >60 mL/min (ref >60)
GFR calc non Af Amer: >60 mL/min (ref >60)
Glucose, Bld: 108 mg/dL — ABNORMAL HIGH (ref 65–99)
Potassium: 3.9 mmol/L (ref 3.5–5.1)
SODIUM: 140 mmol/L (ref 135–145)

## 2017-11-02 LAB — MAGNESIUM: MAGNESIUM: 2.4 mg/dL (ref 1.7–2.4)

## 2017-11-02 LAB — PHOSPHORUS: Phosphorus: 2.6 mg/dL (ref 2.5–4.6)

## 2017-11-02 LAB — PROCALCITONIN: Procalcitonin: 51.8 ng/mL

## 2017-11-02 MED ORDER — INSULIN ASPART 100 UNIT/ML ~~LOC~~ SOLN: 0.0000 [IU] | Freq: Three times a day (TID) | SUBCUTANEOUS | Status: DC

## 2017-11-02 MED ORDER — DOCUSATE SODIUM 50 MG/5ML PO LIQD
100.0000 mg | Freq: Two times a day (BID) | ORAL | Status: DC
  Administered 2017-11-02 – 2017-11-04 (×4): 100 mg via ORAL
  Filled 2017-11-02 (×4): qty 10

## 2017-11-02 MED ORDER — SODIUM CHLORIDE 0.9 % IV SOLN
INTRAVENOUS | Status: DC
  Administered 2017-11-02 – 2017-11-03 (×2): via INTRAVENOUS

## 2017-11-02 NOTE — Progress Notes (Signed)
Christina Everett TEAM 1 - Stepdown/ICU TEAM  Barbaraann BarthelShirley Hamza  ZOX:[id number] DOB: [birth year] DOA: 10/31/2017 PCP: Patient, No Pcp Per    Brief Narrative:  62 year old female with a Hx of mentally disability (lives with her sister) who [facility] w/ fever of 102, nausea, vomiting, and diarrhea. CT A/P noted an obstructing stone within the left ureter with air within the collecting system. The patient was [facility] for cyst[facility] and stent.  Significant Events: [facility]7 [facility] - [facility]7 cystoscopy w/ L ureteral stent > hypotension > neo 4/7 [facility] > [facility]   Subjective: Pt is resting comfortably in bed.  She c/o ongoing nausea w/ poor intake.  She denies cp, vomiting, sob, or HA.    Assessment & Plan:  Septic shock due toObstructive Uropathy in setting of Urethral Stone within Left Proximal Ureter - L Pyonephrosis  s/p ureteral stent placement 4/7 PM by Urology - required low dose Neo gtt intraoperatively and postoperatively - BP stable now off pressors - cont IVF due to poor intake - cont empiric abx in absence of helpful culture data   Pulmonary nodule Chest CT showed 7mm LUL pulmonary nodule - repeat Chest CT in 6-12 months - likely scar   Ascending Thoracic Aortic Aneurysm: CT Chest revealed 4.633mm ascending thoracic aortic aneurysm - will need annual follow-up with CTA or MRI  Cystic lesion in L Mediastinum CT Chest revealed a3.3 x 1.0 x 2.7 cm cystic lesion in the left mediastinum most suggestive of a pericardial cyst - re-evaluate w/ CT in 6 months to assure stable   Acute Kidney Injury Baseline crt unknown - resolved w/ normal creatinine this morning   Mental disability Per her family she is "childlike" at baseline - is alert and talkative but does not have understanding of why she is at the hospital other than that she "feels bad."  DVT prophylaxis: SQ heparin  Code Status: FULL  CODE Family Communication: no family present at time of exam  Disposition Plan: stable for transfer to med/surg med - PT/OT   Consultants:  PCCM  Urology   Antimicrobials:  Vancomycin 4/7>4/9 Zosyn 4/7>  Objective: Blood pressure 103/68, pulse 64, temperature 98.4 F (36.9 C), temperature source Oral, resp. rate 16, height 5\' 6"  (1.676 m), weight 55.6 kg (122 lb 9.2 oz), SpO2 95 %.  Intake/Output Summary (Last 24 hours) at 11/02/2017 0831 Last data filed at 11/02/2017 0600 Gross per 24 hour  Intake 2750 ml  Output 1600 ml  Net 1150 ml   Filed Weights   11/01/17 0100 11/01/17 0200 11/02/17 0500  Weight: 54.2 kg (119 lb 7.8 oz) 54.9 kg (121 lb 0.5 oz) 55.6 kg (122 lb 9.2 oz)    Examination: General: No acute respiratory distress Lungs: Clear to auscultation bilaterally without wheezes or crackles Cardiovascular: Regular rate and rhythm without murmur gallop or rub normal S1 and S2 Abdomen: Nontender, nondistended, soft, bowel sounds positive, no rebound, no ascites, no appreciable mass Extremities: No significant cyanosis, clubbing, or edema bilateral lower extremities  CBC: Recent Labs  Lab 10/31/17 1823 11/01/17 0126 11/02/17 0449  WBC 13.8* 28.3* 36.1*  NEUTROABS 13.3* 25.8*  --   HGB 10.8* 9.1* 9.2*  HCT 32.7* 29.1* 28.2*  MCV 86.5 86.1 86.8  PLT 312 232 237   Basic Metabolic Panel: Recent Labs  Lab 10/31/17 1823 11/01/17 0126 11/02/17 0449  NA 137 139 140  K 3.3* 3.4* 3.9  CL 103 109 110  CO2 21* 19* 22  GLUCOSE 119* 109* 108*  BUN 17 14 16   CREATININE 1.21* 1.30* 0.98  CALCIUM 9.4 8.3* 8.1*  MG  --  1.3* 2.4  PHOS  --  2.9 2.6   GFR: Estimated Creatinine Clearance: 51.6 mL/min (by C-G formula based on SCr of 0.98 mg/dL).  Liver Function Tests: Recent Labs  Lab 10/31/17 1823 11/01/17 0126  AST 36 24  ALT 12* 13*  ALKPHOS 48 45  BILITOT 0.8 0.5  PROT 7.0 5.6*  ALBUMIN 3.4* 2.7*    Coagulation Profile: Recent Labs  Lab 10/31/17 1823   INR 1.04    Cardiac Enzymes: Recent Labs  Lab 11/01/17 0126  TROPONINI <0.03    CBG: Recent Labs  Lab 11/01/17 0745 11/01/17 1137 11/01/17 1606 11/01/17 2035 11/02/17 0648  GLUCAP 174* 139* 122* 116* 100*    Recent Results (from the past 240 hour(s))  MRSA PCR Screening     Status: None   Collection Time: 11/01/17  1:52 AM  Result Value Ref Range Status   MRSA by PCR NEGATIVE NEGATIVE Final    Comment:        The GeneXpert MRSA Assay (FDA approved for NASAL specimens only), is one component of a comprehensive MRSA colonization surveillance program. It is not intended to diagnose MRSA infection nor to guide or monitor treatment for MRSA infections. Performed at [facility], [address] [place], [place] [address]      Scheduled Meds: . chlorhexidine  15 mL Mouth Rinse BID  . docusate  100 mg Per Tube BID  . heparin  5,000 Units Subcutaneous Q8H  . mouth rinse  15 mL Mouth Rinse q12n4p     LOS: 2 days   Lonia Blood, MD [facility] Office  [phone] Pager - Text Page per Amion as per below:  On-Call/Text Page:      [email or web address]      password [email or web address]  If 7PM-7AM, please contact night-coverage [email or web address] Password [email or web address] 11/02/2017, 8:31 AM

## 2017-11-02 NOTE — Progress Notes (Addendum)
Received pt to room 5M10 via wheelchair from the ICU.  No verbal complaints of pain or discomfort.  Put to bed, call bell in reach, bed in low position. Several family members in the room with the pt.  They were very adimant about not allowing certain family members in the room because pt is afraid of her brother name Ricky/Richard.    The names of the family members that are not allowed in the room with her is:  Ricky(Richard) T[place] Sherri Kirkendoll Dianna RossettiMary Mueller [facility] MeetingBunnie Huffman April Mitchem Jennette KettleBrian Parker

## 2017-11-03 LAB — BASIC METABOLIC PANEL
ANION GAP: 10 (ref 5–15)
BUN: 7 mg/dL (ref 6–20)
CHLORIDE: 109 mmol/L (ref 101–111)
CO2: 22 mmol/L (ref 22–32)
Calcium: 8.2 mg/dL — ABNORMAL LOW (ref 8.9–10.3)
Creatinine, Ser: 0.89 mg/dL (ref 0.44–1.00)
GFR calc Af Amer: >60 mL/min (ref >60)
GFR calc non Af Amer: >60 mL/min (ref >60)
GLUCOSE: 86 mg/dL (ref 65–99)
POTASSIUM: 3.8 mmol/L (ref 3.5–5.1)
Sodium: 141 mmol/L (ref 135–145)

## 2017-11-03 LAB — CBC
HEMATOCRIT: 31.7 % — AB (ref 36.0–46.0)
HEMOGLOBIN: 9.7 g/dL — AB (ref 12.0–15.0)
MCH: 26.4 pg (ref 26.0–34.0)
MCHC: 30.6 g/dL (ref 30.0–36.0)
MCV: 86.4 fL (ref 78.0–100.0)
Platelets: 257 10*3/uL (ref 150–400)
RBC: 3.67 MIL/uL — ABNORMAL LOW (ref 3.87–5.11)
RDW: 14.8 % (ref 11.5–15.5)
WBC: 17.7 10*3/uL — ABNORMAL HIGH (ref 4.0–10.5)

## 2017-11-03 LAB — GLUCOSE, CAPILLARY
GLUCOSE-CAPILLARY: 77 mg/dL (ref 65–99)
Glucose-Capillary: 82 mg/dL (ref 65–99)
Glucose-Capillary: 83 mg/dL (ref 65–99)
Glucose-Capillary: 93 mg/dL (ref 65–99)

## 2017-11-03 LAB — PROCALCITONIN: Procalcitonin: 23.42 ng/mL

## 2017-11-03 LAB — MAGNESIUM: Magnesium: 2 mg/dL (ref 1.7–2.4)

## 2017-11-03 MED ORDER — GUAIFENESIN-DM 100-10 MG/5ML PO SYRP
10.0000 mL | ORAL_SOLUTION | ORAL | Status: DC | PRN
  Administered 2017-11-03: 10 mL via ORAL
  Filled 2017-11-03: qty 10

## 2017-11-03 NOTE — Care Management Note (Addendum)
Case Management Note  Patient Details  Name: Christina Everett MRN: [id number] Date of Birth: 12/03/1955  Subjective/Objective:    Admitted for Septic shock secondary to left pyelonephritis/pyelonephrosis           Action/Plan: Call and spoke with sister "Christina Everett", states patient lives with her, Patient information verified.  Discussed recommendation of HH PT and DME: RW.  Sister agreed to therapy but declined DME: Rolling walker.  States she has rolling walker already.  Provided choice and she has no preference.   4:32 pm called sister Christina Everett to get the name of patients PCP; LVMM requesting callback with PCP name.  Advised need PCP in order to set up home health therapy.  [facility] [address] [place], [place].  Referral for Home Health Physical therapy to [place] liaison for [facility] and accepted; also notified of patient discharge order for today.  Expected Discharge Date:       11/04/2017           Expected Discharge Plan:  Home w Home Health Services  Discharge planning Services  CM Consult  Post Acute Care Choice:   Home Health Care Choice offered to:  Sibling(Sister Christina Everett)  DME Arranged:  Dan HumphreysWalker rolling(Sister refused.  Stated she has one already) DME Agency:  NA  HH Arranged:    PT HH Agency:  [facility] Status of Service:  In process, will continue to follow  If discussed at Long Length of Stay Meetings, dates discussed:    Additional Comments:  [name], RN  Nurse case manager [place] 11/03/2017, 4:29 PM

## 2017-11-03 NOTE — Progress Notes (Signed)
Patient ID: Christina Everett, female   DOB: Aug 07, 1955, 62 y.o.   MRN: [id number]  PROGRESS NOTE    Christina Everett  WUJ:[id number] DOB: Mar 10, 1956 DOA: 10/31/2017 PCP: Patient, No Pcp Per   Brief Narrative: 62 year old female with history of mental disability presented on 10/31/2017 with fever, nausea, vomiting and diarrhea.  CT scan of abdomen pelvis had shown an obstructing stone within the left ureter.  Urology was consulted.  She was initially under ICU care because of septic shock and was seen on empiric antibiotics.  She underwent cystoscopy and left ureteral stent placement by urology on 10/31/2017.  Cultures have been negative so far.  She was transferred to [facility] care on 11/02/2017.  Assessment & Plan:   Active Problems:   Septic shock (HCC)   Septic shock secondary to left pyelonephritis/pyelonephrosis -Resolved.  Blood pressure stable.  Antibiotics plan as below. -Discontinue IV fluids  Acute left pyelonephritis secondary to left ureteral stone causing obstructive uropathy and left pyonephrosis -Status post cystoscopy and left ureteral stent placement on 10/31/2017 by urology -Continue broad-spectrum antibiotics.  Probable switch to oral antibiotics tomorrow to complete 10-14 days of antibiotic therapy -Outpatient follow-up with urology -Cultures negative so far  Leukocytosis -Improving.  Repeat a.m. Labs  Pulmonary nodule Chest CT showed 7mm LUL pulmonary nodule - repeat Chest CT in 6-12 months - likely scar   Ascending Thoracic Aortic Aneurysm: CT Chest revealed 4.3mm ascending thoracic aortic aneurysm - will need annual follow-up with CTA or MRI  Cystic lesion in L Mediastinum CT Chest revealed a3.3 x 1.0 x 2.7 cm cystic lesion in the left mediastinum most suggestive of a pericardial cyst - re-evaluate with CT in 6 months to assure stable   Acute Kidney Injury Baseline creatinine unknown  - resolved    Mental disability -Mental status at baseline.  Outpatient  follow-up  Generalized deconditioning -PT eval.    DVT prophylaxis: Subcutaneous heparin Code Status: Full Family Communication: Spoke to sister Kendal Hymen on phone Disposition Plan: Probable home tomorrow if medically stable  Consultants: Urology/PCCM  Procedures: Cystoscopy and left ureteral stent placement by urology on 10/31/2017  Antimicrobials: Vancomycin 4/7>4/9 Zosyn 4/7>   Subjective: Patient seen and examined at bedside.  She is awake.  No overnight fever or vomiting.  Objective: Vitals:   11/02/17 1600 11/02/17 2015 11/03/17 0523 11/03/17 1054  BP:  101/68 119/65 131/68  Pulse: 79 76 65 74  Resp: (!) 22   16  Temp:  97.6 F (36.4 C) 97.9 F (36.6 C) 98.9 F (37.2 C)  TempSrc:  Oral Oral Oral  SpO2: 95% 97% 98% 94%  Weight:  55.4 kg (122 lb 2.2 oz)    Height:        Intake/Output Summary (Last 24 hours) at 11/03/2017 1321 Last data filed at 11/03/2017 1030 Gross per 24 hour  Intake 225 ml  Output 4750 ml  Net -4525 ml   Filed Weights   11/01/17 0200 11/02/17 0500 11/02/17 2015  Weight: 54.9 kg (121 lb 0.5 oz) 55.6 kg (122 lb 9.2 oz) 55.4 kg (122 lb 2.2 oz)    Examination:  General exam: Appears calm and comfortable.  Poor historian Respiratory system: Bilateral decreased breath sound at bases Cardiovascular system: S1 & S2 heard, rate controlled  gastrointestinal system: Abdomen is nondistended, soft and nontender. Normal bowel sounds heard. Extremities: No cyanosis, clubbing, edema   Data Reviewed: I have personally reviewed following labs and imaging studies  CBC: Recent Labs  Lab 10/31/17 1823 11/01/17 0126 11/02/17  0449 11/03/17 0703  WBC 13.8* 28.3* 36.1* 17.7*  NEUTROABS 13.3* 25.8*  --   --   HGB 10.8* 9.1* 9.2* 9.7*  HCT 32.7* 29.1* 28.2* 31.7*  MCV 86.5 86.1 86.8 86.4  PLT 312 232 237 257   Basic Metabolic Panel: Recent Labs  Lab 10/31/17 1823 11/01/17 0126 11/02/17 0449 11/03/17 0703  NA 137 139 140 141  K 3.3* 3.4* 3.9  3.8  CL 103 109 110 109  CO2 21* 19* 22 22  GLUCOSE 119* 109* 108* 86  BUN 17 14 16 7   CREATININE 1.21* 1.30* 0.98 0.89  CALCIUM 9.4 8.3* 8.1* 8.2*  MG  --  1.3* 2.4 2.0  PHOS  --  2.9 2.6  --    GFR: Estimated Creatinine Clearance: 56.6 mL/min (by C-G formula based on SCr of 0.89 mg/dL). Liver Function Tests: Recent Labs  Lab 10/31/17 1823 11/01/17 0126  AST 36 24  ALT 12* 13*  ALKPHOS 48 45  BILITOT 0.8 0.5  PROT 7.0 5.6*  ALBUMIN 3.4* 2.7*   No results for input(s): LIPASE, AMYLASE in the last 168 hours. No results for input(s): AMMONIA in the last 168 hours. Coagulation Profile: Recent Labs  Lab 10/31/17 1823  INR 1.04   Cardiac Enzymes: Recent Labs  Lab 11/01/17 0126  TROPONINI <0.03   BNP (last 3 results) No results for input(s): PROBNP in the last 8760 hours. HbA1C: No results for input(s): HGBA1C in the last 72 hours. CBG: Recent Labs  Lab 11/02/17 1133 11/02/17 1729 11/02/17 2014 11/03/17 0817 11/03/17 1217  GLUCAP 97 124* 120* 77 82   Lipid Profile: No results for input(s): CHOL, HDL, LDLCALC, TRIG, CHOLHDL, LDLDIRECT in the last 72 hours. Thyroid Function Tests: No results for input(s): TSH, T4TOTAL, FREET4, T3FREE, THYROIDAB in the last 72 hours. Anemia Panel: No results for input(s): VITAMINB12, FOLATE, FERRITIN, TIBC, IRON, RETICCTPCT in the last 72 hours. Sepsis Labs: Recent Labs  Lab 11/01/17 0126 11/01/17 0405 11/01/17 0751 11/01/17 1354 11/02/17 0449 11/03/17 0703  PROCALCITON 68.70  --   --   --  51.80 23.42  LATICACIDVEN 2.7* 2.1* 2.3* 2.3*  --   --     Recent Results (from the past 240 hour(s))  Culture, blood (Routine x 2)     Status: None (Preliminary result)   Collection Time: 10/31/17  6:15 PM  Result Value Ref Range Status   Specimen Description   Final    BLOOD LEFT ANTECUBITAL Performed at [facility], [address] [place], [place] [address]    Special Requests   Final    BOTTLES DRAWN AEROBIC  AND ANAEROBIC Blood Culture adequate volume Performed at [facility], [address] [place], [place] [address]    Culture   Final    NO GROWTH 1 DAY Performed at [facility], [address] [place], [place] [address]    Report Status PENDING  Incomplete  Culture, blood (Routine x 2)     Status: None (Preliminary result)   Collection Time: 10/31/17  6:23 PM  Result Value Ref Range Status   Specimen Description   Final    BLOOD BLOOD RIGHT WRIST Performed at [facility], [address] [place], [place] [address]    Special Requests   Final    BOTTLES DRAWN AEROBIC AND ANAEROBIC Blood Culture adequate volume Performed at [facility], [address] [place], [place] [address]  Culture   Final    NO GROWTH 1 DAY Performed at [facility], [address] [place], [place] [address]    Report Status PENDING  Incomplete  MRSA PCR Screening     Status: None   Collection Time: 11/01/17  1:52 AM  Result Value Ref Range Status   MRSA by PCR NEGATIVE NEGATIVE Final    Comment:        The GeneXpert MRSA Assay (FDA approved for NASAL specimens only), is one component of a comprehensive MRSA colonization surveillance program. It is not intended to diagnose MRSA infection nor to guide or monitor treatment for MRSA infections. Performed at [facility], [address] [place], [place] [address]          Radiology Studies: No results found.      Scheduled Meds: . docusate  100 mg Oral BID  . heparin  5,000 Units Subcutaneous Q8H  . insulin aspart  0-9 Units Subcutaneous TID WC   Continuous Infusions: . piperacillin-tazobactam (ZOSYN)  IV 3.375 g (11/03/17 1020)     LOS: 3 days        Glade LloydKshitiz Davona Kinoshita, MD [facility] Pager [phone]  If 7PM-7AM, please contact night-coverage [email or web address] Password [email or web address] 11/03/2017, 1:21 PM

## 2017-11-03 NOTE — Evaluation (Signed)
Physical Therapy Evaluation Patient Details Name: Christina Everett MRN: [id number] DOB: 01-04-1956 Today's Date: 11/03/2017   History of Present Illness  Pt is a 62 y/o female admitted secondary to septic shock and hypotension. Imaging revealed obstructing stone in L ureter. Pt is s/p L cystoscopy with stent placement. PMH includes mental disability.   Clinical Impression  Pt admitted secondary to problem above with deficits below. Pt presenting with unsteadiness and weakness, however, tolerated gait training well. Required gross min to min guard for mobility with RW this session. Reports she lives with sister who is able to provide assist, however, no family present during session. Will need to ensure appropriate assist prior to return home; feel pt would benefit from supervision with mobility initially. If pt does not have assist at home, may need to consider short term SNF prior to d/c home. Will continue to follow acutely to maximize functional mobility independence and safety.     Follow Up Recommendations Home health PT;Supervision/Assistance - 24 hour    Equipment Recommendations  Rolling walker with 5" wheels    Recommendations for Other Services OT consult     Precautions / Restrictions Precautions Precautions: Fall Restrictions Weight Bearing Restrictions: No      Mobility  Bed Mobility Overal bed mobility: Needs Assistance Bed Mobility: Supine to Sit     Supine to sit: Supervision     General bed mobility comments: Supervision for safety. Increased time required.   Transfers Overall transfer level: Needs assistance Equipment used: None;Rolling walker (2 wheeled) Transfers: Sit to/from Stand Sit to Stand: Mod assist;Min assist         General transfer comment: Attempted to stand without AD and pt requiring mod A for steadying assist secondary to posterior lean. Attempted with RW and pt with increased steadiness and only required min A for steadying. Verbal cues  for safe  hand placement.   Ambulation/Gait Ambulation/Gait assistance: Min guard Ambulation Distance (Feet): 75 Feet Assistive device: Rolling walker (2 wheeled) Gait Pattern/deviations: Step-through pattern;Decreased stride length Gait velocity: Decreased  Gait velocity interpretation: Below normal speed for age/gender General Gait Details: Slow, guarded gait. Very short step length and gave cues for increasing step length. Cues for proper use of RW and sequencing using RW. Mild unsteadiness noted, however, no overt LOB.   Stairs            Wheelchair Mobility    Modified Rankin (Stroke Patients Only)       Balance Overall balance assessment: Needs assistance Sitting-balance support: No upper extremity supported;Feet supported Sitting balance-Leahy Scale: Good     Standing balance support: Bilateral upper extremity supported;During functional activity Standing balance-Leahy Scale: Poor Standing balance comment: Reliant on BUE support for balance.                              Pertinent Vitals/Pain Pain Assessment: No/denies pain    Home Living Family/patient expects to be discharged to:: Private residence Living Arrangements: Other relatives(sister ) Available Help at Discharge: Family;Available 24 hours/day Type of Home: Mobile home Home Access: Stairs to enter Entrance Stairs-Rails: None Entrance Stairs-Number of Steps: 2 Home Layout: One level Home Equipment: Walker - 2 wheels      Prior Function Level of Independence: Independent               Hand Dominance        Extremity/Trunk Assessment   Upper Extremity Assessment Upper Extremity Assessment: Defer to OT  evaluation    Lower Extremity Assessment Lower Extremity Assessment: Generalized weakness    Cervical / Trunk Assessment Cervical / Trunk Assessment: Kyphotic  Communication   Communication: No difficulties  Cognition Arousal/Alertness: Awake/alert Behavior During  Therapy: WFL for tasks assessed/performed Overall Cognitive Status: History of cognitive impairments - at baseline                                        General Comments General comments (skin integrity, edema, etc.): Pt did not have teeth, and could not chew sandwhich, so notified NT that pt needed softer diet.     Exercises     Assessment/Plan    PT Assessment Patient needs continued PT services  PT Problem List Decreased strength;Decreased balance;Decreased mobility;Decreased cognition;Decreased knowledge of use of DME;Decreased knowledge of precautions       PT Treatment Interventions DME instruction;Gait training;Stair training;Functional mobility training;Therapeutic activities;Balance training;Therapeutic exercise;Neuromuscular re-education;Patient/family education    PT Goals (Current goals can be found in the Care Plan section)  Acute Rehab PT Goals Patient Stated Goal: to go home  PT Goal Formulation: With patient Time For Goal Achievement: 11/17/17 Potential to Achieve Goals: Good    Frequency Min 3X/week   Barriers to discharge        Co-evaluation               AM-PAC PT "6 Clicks" Daily Activity  Outcome Measure Difficulty turning over in bed (including adjusting bedclothes, sheets and blankets)?: A Little Difficulty moving from lying on back to sitting on the side of the bed? : A Little Difficulty sitting down on and standing up from a chair with arms (e.g., wheelchair, bedside commode, etc,.)?: Unable Help needed moving to and from a bed to chair (including a wheelchair)?: A Little Help needed walking in hospital room?: A Little Help needed climbing 3-5 steps with a railing? : A Lot 6 Click Score: 15    End of Session Equipment Utilized During Treatment: Gait belt Activity Tolerance: Patient tolerated treatment well Patient left: in chair;with call bell/phone within reach;with chair alarm set Nurse Communication: Mobility  status PT Visit Diagnosis: Unsteadiness on feet (R26.81);Muscle weakness (generalized) (M62.81)    Time: [id number] PT Time Calculation (min) (ACUTE ONLY): 29 min   Charges:   PT Evaluation $PT Eval Low Complexity: 1 Low PT Treatments $Gait Training: 8-22 mins   PT G Codes:        Gladys DammeBrittany Yazaira Speas, PT, DPT  Acute Rehabilitation Services  Pager: [phone]   Lehman PromBrittany S Phyllicia Dudek 11/03/2017, 2:23 PM

## 2017-11-04 LAB — CBC WITH DIFFERENTIAL/PLATELET
BASOS PCT: 0 %
Basophils Absolute: 0 10*3/uL (ref 0.0–0.1)
Eosinophils Absolute: 0 10*3/uL (ref 0.0–0.7)
Eosinophils Relative: 0 %
HEMATOCRIT: 33.9 % — AB (ref 36.0–46.0)
HEMOGLOBIN: 10.9 g/dL — AB (ref 12.0–15.0)
LYMPHS ABS: 1.7 10*3/uL (ref 0.7–4.0)
LYMPHS PCT: 18 %
MCH: 27.5 pg (ref 26.0–34.0)
MCHC: 32.2 g/dL (ref 30.0–36.0)
MCV: 85.4 fL (ref 78.0–100.0)
MONOS PCT: 7 %
Monocytes Absolute: 0.6 10*3/uL (ref 0.1–1.0)
NEUTROS ABS: 7.1 10*3/uL (ref 1.7–7.7)
NEUTROS PCT: 75 %
Platelets: 300 10*3/uL (ref 150–400)
RBC: 3.97 MIL/uL (ref 3.87–5.11)
RDW: 14.7 % (ref 11.5–15.5)
WBC: 9.4 10*3/uL (ref 4.0–10.5)

## 2017-11-04 LAB — BASIC METABOLIC PANEL
ANION GAP: 13 (ref 5–15)
BUN: 5 mg/dL — ABNORMAL LOW (ref 6–20)
CALCIUM: 8.3 mg/dL — AB (ref 8.9–10.3)
CHLORIDE: 106 mmol/L (ref 101–111)
CO2: 19 mmol/L — ABNORMAL LOW (ref 22–32)
Creatinine, Ser: 0.89 mg/dL (ref 0.44–1.00)
GFR calc Af Amer: >60 mL/min (ref >60)
Glucose, Bld: 86 mg/dL (ref 65–99)
POTASSIUM: 3.1 mmol/L — AB (ref 3.5–5.1)
Sodium: 138 mmol/L (ref 135–145)

## 2017-11-04 LAB — GLUCOSE, CAPILLARY
Glucose-Capillary: 82 mg/dL (ref 65–99)
Glucose-Capillary: 97 mg/dL (ref 65–99)

## 2017-11-04 LAB — MAGNESIUM: MAGNESIUM: 1.9 mg/dL (ref 1.7–2.4)

## 2017-11-04 MED ORDER — POTASSIUM CHLORIDE CRYS ER 20 MEQ PO TBCR
40.0000 meq | EXTENDED_RELEASE_TABLET | ORAL | Status: AC
  Administered 2017-11-04 (×2): 40 meq via ORAL
  Filled 2017-11-04 (×2): qty 2

## 2017-11-04 MED ORDER — CEPHALEXIN 500 MG PO CAPS: 500.0000 mg | ORAL_CAPSULE | Freq: Three times a day (TID) | ORAL | 0 refills | Status: AC

## 2017-11-04 MED ORDER — GUAIFENESIN-DM 100-10 MG/5ML PO SYRP: 10.0000 mL | ORAL_SOLUTION | ORAL | 0 refills | Status: DC | PRN

## 2017-11-04 NOTE — Evaluation (Signed)
Occupational Therapy Evaluation Patient Details Name: Christina Everett MRN: [id number] DOB: 07-30-1955 Today's Date: 11/04/2017    History of Present Illness Pt is a 62 y/o female admitted secondary to septic shock and hypotension. Imaging revealed obstructing stone in L ureter. Pt is s/p L cystoscopy with stent placement. PMH includes mental disability.    Clinical Impression   Pt is functioning at a supervision level in ADL for safety. No OT needs. Pt will have 24 hour care of her sister upon discharge.    Follow Up Recommendations  No OT follow up    Equipment Recommendations  None recommended by OT    Recommendations for Other Services       Precautions / Restrictions Precautions Precautions: Fall Restrictions Weight Bearing Restrictions: No      Mobility Bed Mobility Overal bed mobility: Modified Independent                Transfers Overall transfer level: Needs assistance Equipment used: None Transfers: Sit to/from Stand Sit to Stand: Supervision         General transfer comment: from bed and standard toilet    Balance     Sitting balance-Leahy Scale: Good       Standing balance-Leahy Scale: Fair                             ADL either performed or assessed with clinical judgement   ADL                                         General ADL Comments: Performed toileting, standing grooming, dressing with supervision.     Vision Baseline Vision/History: No visual deficits Patient Visual Report: No change from baseline       Perception     Praxis      Pertinent Vitals/Pain Pain Assessment: No/denies pain     Hand Dominance Right   Extremity/Trunk Assessment Upper Extremity Assessment Upper Extremity Assessment: Overall WFL for tasks assessed   Lower Extremity Assessment Lower Extremity Assessment: Defer to PT evaluation       Communication Communication Communication: No difficulties   Cognition  Arousal/Alertness: Awake/alert Behavior During Therapy: WFL for tasks assessed/performed Overall Cognitive Status: History of cognitive impairments - at baseline                                     General Comments       Exercises     Shoulder Instructions      Home Living Family/patient expects to be discharged to:: Private residence Living Arrangements: Other relatives(sister) Available Help at Discharge: Family;Available 24 hours/day Type of Home: Mobile home Home Access: Stairs to enter Entrance Stairs-Number of Steps: 2 Entrance Stairs-Rails: None Home Layout: One level     Bathroom Shower/Tub: Chief Strategy Officer: Standard     Home Equipment: Environmental consultant - 2 wheels          Prior Functioning/Environment Level of Independence: Independent                 OT Problem List:        OT Treatment/Interventions:      OT Goals(Current goals can be found in the care plan section) Acute Rehab OT Goals Patient Stated Goal: to  go home   OT Frequency:     Barriers to D/C:            Co-evaluation              AM-PAC PT "6 Clicks" Daily Activity     Outcome Measure Help from another person eating meals?: None Help from another person taking care of personal grooming?: A Little Help from another person toileting, which includes using toliet, bedpan, or urinal?: A Little Help from another person bathing (including washing, rinsing, drying)?: A Little Help from another person to put on and taking off regular upper body clothing?: None Help from another person to put on and taking off regular lower body clothing?: A Little 6 Click Score: 20   End of Session Equipment Utilized During Treatment: Gait belt  Activity Tolerance: Patient tolerated treatment well Patient left: Other (comment)(walking with PT)  OT Visit Diagnosis: Unsteadiness on feet (R26.81)                Time: [id number] OT Time Calculation (min): 17  min Charges:  OT General Charges $OT Visit: 1 Visit OT Evaluation $OT Eval Moderate Complexity: 1 Mod G-Codes:     11/04/2017 Martie RoundJulie Joslyn Ramos, OTR/L Pager: [phone] [place] PlanasMay[place], [place] BailiffJulie Lynn 11/04/2017, 11:39 AM

## 2017-11-04 NOTE — Care Management Important Message (Signed)
Important Message  Patient Details  Name: Christina Everett MRN: [id number] Date of Birth: 11/29/1955   Medicare Important Message Given:  Yes    Divya Munshi Stefan ChurchBratton 11/04/2017, 12:58 PM

## 2017-11-04 NOTE — Discharge Summary (Signed)
Physician Discharge Summary  Linward NatalShirley XXXIsol ZOX:[id number] DOB: 1956-06-02 DOA: 10/31/2017  PCP: Patient, No Pcp Per  Admit date: 10/31/2017 Discharge date: 11/04/2017  Admitted From: Home Disposition: Home  Recommendations for Outpatient Follow-up:  1. Follow up with PCP in 1week with repeat CBC/BMP 2. Follow-up with urology as an outpatient   Home Health: Yes Equipment/Devices: None   discharge Condition: Stable CODE STATUS: Full Diet recommendation: Heart Healthy  Brief/Interim Summary: 62 year old female with history of mental disability presented on 10/31/2017 with fever, nausea, vomiting and diarrhea.  CT scan of abdomen pelvis had shown an obstructing stone within the left ureter.  Urology was consulted.  She was initially under ICU care because of septic shock and was seen on empiric antibiotics.  She underwent cystoscopy and left ureteral stent placement by urology on 10/31/2017.  Cultures have been negative so far.  [facility].  Her leukocytosis has resolved.  She is hemodynamically stable.  She will be discharged home with home health on oral antibiotics with outpatient follow-up with urology.      Discharge Diagnoses:  Active Problems:   Septic shock (HCC)   Septic shock secondary to left pyelonephritis/pyelonephrosis -Resolved.  Blood pressure stable.  Antibiotics plan as below.  Acute left pyelonephritis secondary to left ureteral stone causing obstructive uropathy and left pyonephrosis -Status post cystoscopy and left ureteral stent placement on 10/31/2017 by urology -Currently on broad-spectrum antibiotics.  Culture is negative so far. -Discharge home on oral Keflex for 10 more days. -Outpatient follow-up with urology   Leukocytosis -Probably from above.  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  Outpatient follow-up  Generalized deconditioning -Will need home health on discharge.  Hypokalemia -We will replace prior to discharge.  Outpatient follow-up   Discharge Instructions  Discharge Instructions    Diet - low sodium heart healthy   Complete by:  As directed    Face-to-face encounter (required for Medicare/Medicaid patients)   Complete by:  As directed    I Zahki Hoogendoorn certify that this patient is under my care and that I, or a nurse practitioner or physician's assistant working with me, had a face-to-face encounter that meets the physician face-to-face encounter requirements with this patient on 11/04/2017. The encounter with the patient was in whole, or in part for the following medical condition(s) which is the primary reason for home health care (List medical condition): Pyelonephritis   The encounter with the patient was in whole, or in part, for the following medical condition, which is the primary reason for home health care:  Pyelonephritis   I certify that, based on my findings, the following services are medically necessary home health services:   Nursing Physical therapy     Reason for Medically Necessary Home Health Services:   Skilled Nursing- Change/Decline in Patient Status Therapy- Therapeutic Exercises to Increase Strength and Endurance     My clinical findings support the need for the above services:  Unable to leave home safely without  assistance and/or assistive device   Further, I certify that my clinical findings support that this patient is homebound due  to:  Unable to leave home safely without assistance   Home Health   Complete by:  As directed    To provide the following care/treatments:   PT RN     Increase activity slowly   Complete by:  As directed      Allergies as of 11/04/2017      Reactions   Melatonin Other (See Comments)   Causes nightmares      Medication List    TAKE these medications   acetaminophen 500 MG tablet Commonly known as:  TYLENOL Take 500 mg by mouth every 6 (six) hours as needed for fever or headache (pain).   cephALEXin 500 MG capsule Commonly known as:  KEFLEX Take 1 capsule (500 mg total) by mouth 3 (three) times daily for 10 days.   guaiFENesin-dextromethorphan 100-10 MG/5ML syrup Commonly known as:  ROBITUSSIN DM Take 10 mLs by mouth every 4 (four) hours as needed for cough.   multivitamin with minerals Tabs tablet Take 1 tablet by mouth daily.      Follow-up Information    Crist Fat, MD. Schedule an appointment as soon as possible for a visit in 2 weeks.   Specialty:  Urology Contact information: [address] [place] [address] [phone]        PCP. Schedule an appointment as soon as possible for a visit in 1 week(s).   Why:  with repeat CBC/BMP         Allergies  Allergen Reactions  . Melatonin Other (See Comments)    Causes nightmares    Consultations:  Urology   Procedures/Studies: Dg Chest 2 View  Result Date: 10/31/2017 CLINICAL DATA:  Nausea with vomiting and diarrhea. EXAM: CHEST - 2 VIEW COMPARISON:  None. FINDINGS: Asymmetric masslike opacity identified in the right apex. Lungs are hyperexpanded. No edema or focal airspace consolidation. The cardiopericardial silhouette is within normal limits for size. The visualized bony structures of the thorax are intact. IMPRESSION: Asymmetric masslike density in the right apex. CT chest recommended to further evaluate. Electronically Signed   By: Kennith Center M.D.   On: 10/31/2017 18:26   Ct Chest W  Contrast  Result Date: 10/31/2017 CLINICAL DATA:  Generalized abdominal pain with fever of unknown origin. EXAM: CT CHEST, ABDOMEN AND PELVIS WITHOUT CONTRAST TECHNIQUE: Multidetector CT imaging of the chest, abdomen and pelvis was performed following the standard protocol without IV contrast. COMPARISON:  Chest x-ray from earlier today FINDINGS: CT CHEST FINDINGS Cardiovascular: Heart size upper normal to mildly increased. No pericardial effusion. Coronary artery calcification is evident. Ascending thoracic aorta measures up to 4.3 cm diameter. Mediastinum/Nodes: No mediastinal lymphadenopathy. 3.3 x 1.0 x 2.7 cm homogeneous lesion is identified in the left mediastinum, the level of the AP window. This lesion has an imperceptible wall and approaches water attenuation, most consistent with a cyst. There is no hilar lymphadenopathy. There is no axillary lymphadenopathy. Lungs/Pleura: Biapical pleuroparenchymal scarring is identified. By CT, the findings are symmetric and platelike, most compatible with scarring. The asymmetry suggested on the chest x-ray earlier today is not apparent on CT. 7 mm left upper lobe nodule (image 25/series 4) may also be scarring related. Expiratory imaging of the lung shows subtle mosaic attenuation, likely related to air trapping. No dense focal airspace consolidation. No pleural effusion. Musculoskeletal: Bone windows reveal no worrisome lytic or sclerotic osseous lesions. CT  ABDOMEN PELVIS FINDINGS Hepatobiliary: No focal abnormality within the liver parenchyma. There is no evidence for gallstones, gallbladder wall thickening, or pericholecystic fluid. No intrahepatic or extrahepatic biliary dilation. Pancreas: No focal mass lesion. No dilatation of the main duct. No intraparenchymal cyst. No peripancreatic edema. Spleen: No splenomegaly. No focal mass lesion. Adrenals/Urinary Tract: No adrenal nodule or mass. 2 mm nonobstructing stone identified lower pole right kidney. Right kidney  otherwise unremarkable. Right ureter is normal. Moderate to high-grade left hydronephrosis is identified with a 2.1 x 1.4 x 1.3 cm gas containing stone lodged at the UPJ. Decreased perfusion to the left kidney is compatible with obstructive uropathy. Gas bubbles are seen in the dilated left renal pelvis and in the dilated left renal calices. Left ureter distal to the UPJ is nondilated, but multiple gas bubbles are seen in the mid and distal segments. Urinary bladder is moderately distended with intraluminal gas. Stomach/Bowel: Tiny hiatal hernia. Stomach otherwise unremarkable. Duodenum is normally positioned as is the ligament of Treitz. No small bowel wall thickening. No small bowel dilatation. The terminal ileum is normal. The appendix is not visualized, but there is no edema or inflammation in the region of the cecum. Moderate stool volume in the right colon with decompressed transverse and left colon. No obstructing mass lesion evident although motion artifact degrades imaging through the mid transverse segment. Vascular/Lymphatic: There is abdominal aortic atherosclerosis without aneurysm. There is no gastrohepatic or hepatoduodenal ligament lymphadenopathy. No intraperitoneal or retroperitoneal lymphadenopathy. Small left para-aortic lymph nodes are evident. No pelvic sidewall lymphadenopathy. Reproductive: The uterus has normal CT imaging appearance. There is no adnexal mass. Other: No intraperitoneal free fluid. Musculoskeletal: Bone windows reveal no worrisome lytic or sclerotic osseous lesions. 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 IMPRESSION: 1. Moderate to high-grade left hydroureteronephrosis with decreased perfusion of the left kidney consistent with obstructive uropathy. These changes are related to a 2.1 X 1.4 x 1.3 cm gas containing stone lodged at the UPJ. Additional gas is identified in the dilated renal pelvis and collecting systems of  the left kidney as well as along the course of the nondilated left ureter distal to the stone and in the urinary bladder. Imaging features compatible with infection and left pyonephrosis. 2. Symmetric biapical pleuroparenchymal scarring by CT. No evidence for right apical mass as suggested on chest x-ray earlier today. 3. 7 mm left upper lobe pulmonary nodule may be scar. Non-contrast chest CT at 6-12 months is recommended. If the nodule is stable at time of repeat CT, then future CT at 18-24 months (from today's scan) is considered optional for low-risk patients, but is recommended for high-risk patients. This recommendation follows the consensus statement: Guidelines for Management of Incidental Pulmonary Nodules Detected on CT Images: From the Fleischner Society 2017; Radiology 2017; 284:228-243. 4. 4.3 cm diameter ascending thoracic aortic aneurysm. Recommend annual imaging followup by CTA or MRA. This recommendation follows 2010 ACCF/AHA/AATS/ACR/ASA/SCA/SCAI/SIR/STS/SVM Guidelines for the Diagnosis and Management of Patients with Thoracic Aortic Disease. Circulation. 2010; 121: e266-e369 5. 3.3 x 1.0 x 2.7 cm cystic lesion in the left mediastinum. Imaging features most suggestive of pericardial cyst. Attention on follow-up recommended to ensure stability. 6. 2 mm nonobstructing right renal stone. 7.  Aortic Atherosclerois (ICD10-170.0) Electronically Signed   By: Kennith Center M.D.   On: 10/31/2017 20:35   Ct Abdomen Pelvis W Contrast  Result Date: 10/31/2017 CLINICAL DATA:  Generalized abdominal pain with fever of unknown origin. EXAM:  CT CHEST, ABDOMEN AND PELVIS WITHOUT CONTRAST TECHNIQUE: Multidetector CT imaging of the chest, abdomen and pelvis was performed following the standard protocol without IV contrast. COMPARISON:  Chest x-ray from earlier today FINDINGS: CT CHEST FINDINGS Cardiovascular: Heart size upper normal to mildly increased. No pericardial effusion. Coronary artery calcification is  evident. Ascending thoracic aorta measures up to 4.3 cm diameter. Mediastinum/Nodes: No mediastinal lymphadenopathy. 3.3 x 1.0 x 2.7 cm homogeneous lesion is identified in the left mediastinum, the level of the AP window. This lesion has an imperceptible wall and approaches water attenuation, most consistent with a cyst. There is no hilar lymphadenopathy. There is no axillary lymphadenopathy. Lungs/Pleura: Biapical pleuroparenchymal scarring is identified. By CT, the findings are symmetric and platelike, most compatible with scarring. The asymmetry suggested on the chest x-ray earlier today is not apparent on CT. 7 mm left upper lobe nodule (image 25/series 4) may also be scarring related. Expiratory imaging of the lung shows subtle mosaic attenuation, likely related to air trapping. No dense focal airspace consolidation. No pleural effusion. Musculoskeletal: Bone windows reveal no worrisome lytic or sclerotic osseous lesions. CT ABDOMEN PELVIS FINDINGS Hepatobiliary: No focal abnormality within the liver parenchyma. There is no evidence for gallstones, gallbladder wall thickening, or pericholecystic fluid. No intrahepatic or extrahepatic biliary dilation. Pancreas: No focal mass lesion. No dilatation of the main duct. No intraparenchymal cyst. No peripancreatic edema. Spleen: No splenomegaly. No focal mass lesion. Adrenals/Urinary Tract: No adrenal nodule or mass. 2 mm nonobstructing stone identified lower pole right kidney. Right kidney otherwise unremarkable. Right ureter is normal. Moderate to high-grade left hydronephrosis is identified with a 2.1 x 1.4 x 1.3 cm gas containing stone lodged at the UPJ. Decreased perfusion to the left kidney is compatible with obstructive uropathy. Gas bubbles are seen in the dilated left renal pelvis and in the dilated left renal calices. Left ureter distal to the UPJ is nondilated, but multiple gas bubbles are seen in the mid and distal segments. Urinary bladder is moderately  distended with intraluminal gas. Stomach/Bowel: Tiny hiatal hernia. Stomach otherwise unremarkable. Duodenum is normally positioned as is the ligament of Treitz. No small bowel wall thickening. No small bowel dilatation. The terminal ileum is normal. The appendix is not visualized, but there is no edema or inflammation in the region of the cecum. Moderate stool volume in the right colon with decompressed transverse and left colon. No obstructing mass lesion evident although motion artifact degrades imaging through the mid transverse segment. Vascular/Lymphatic: There is abdominal aortic atherosclerosis without aneurysm. There is no gastrohepatic or hepatoduodenal ligament lymphadenopathy. No intraperitoneal or retroperitoneal lymphadenopathy. Small left para-aortic lymph nodes are evident. No pelvic sidewall lymphadenopathy. Reproductive: The uterus has normal CT imaging appearance. There is no adnexal mass. Other: No intraperitoneal free fluid. Musculoskeletal: Bone windows reveal no worrisome lytic or sclerotic osseous lesions. 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 IMPRESSION: 1. Moderate to high-grade left hydroureteronephrosis with decreased perfusion of the left kidney consistent with obstructive uropathy. These changes are related to a 2.1 X 1.4 x 1.3 cm gas containing stone lodged at the UPJ. Additional gas is identified in the dilated renal pelvis and collecting systems of the left kidney as well as along the course of the nondilated left ureter distal to the stone and in the urinary bladder. Imaging features compatible with infection and left pyonephrosis. 2. Symmetric biapical pleuroparenchymal scarring by CT. No evidence for right apical mass as suggested on chest  x-ray earlier today. 3. 7 mm left upper lobe pulmonary nodule may be scar. Non-contrast chest CT at 6-12 months is recommended. If the nodule is stable at time of repeat CT, then  future CT at 18-24 months (from today's scan) is considered optional for low-risk patients, but is recommended for high-risk patients. This recommendation follows the consensus statement: Guidelines for Management of Incidental Pulmonary Nodules Detected on CT Images: From the Fleischner Society 2017; Radiology 2017; 284:228-243. 4. 4.3 cm diameter ascending thoracic aortic aneurysm. Recommend annual imaging followup by CTA or MRA. This recommendation follows 2010 ACCF/AHA/AATS/ACR/ASA/SCA/SCAI/SIR/STS/SVM Guidelines for the Diagnosis and Management of Patients with Thoracic Aortic Disease. Circulation. 2010; 121: e266-e369 5. 3.3 x 1.0 x 2.7 cm cystic lesion in the left mediastinum. Imaging features most suggestive of pericardial cyst. Attention on follow-up recommended to ensure stability. 6. 2 mm nonobstructing right renal stone. 7.  Aortic Atherosclerois (ICD10-170.0) Electronically Signed   By: Kennith Center M.D.   On: 10/31/2017 20:35   Dg Chest Port 1 View  Result Date: 11/01/2017 CLINICAL DATA:  Post op for Infected left ureteral stone with sepsis EXAM: PORTABLE CHEST 1 VIEW COMPARISON:  10/31/2017. FINDINGS: Bibasilar subsegmental atelectasis with effusions. No consolidation or edema. Partially visualized LEFT pigtail catheter. Cardiac silhouette within normal limits for degree of inspiration. IMPRESSION: Worsening aeration. Bibasilar subsegmental atelectasis with effusions. No consolidation or edema. Electronically Signed   By: Elsie Stain M.D.   On: 11/01/2017 07:23   Dg C-arm 1-60 Min-no Report  Result Date: 10/31/2017 Fluoroscopy was utilized by the requesting physician.  No radiographic interpretation.    Cystoscopy and left ureteral stent placement by urology on 10/31/2017     Subjective: Patient seen and examined at bedside.  She is awake but a poor historian.  No overnight fever or vomiting.  Discharge Exam: Vitals:   11/03/17 2215 11/04/17 0511  BP: 127/67 124/64  Pulse: 81 82   Resp: 16 16  Temp: 99 F (37.2 C) 98.2 F (36.8 C)  SpO2: 94% 94%   Vitals:   11/03/17 1054 11/03/17 1516 11/03/17 2215 11/04/17 0511  BP: 131/68 123/67 127/67 124/64  Pulse: 74 73 81 82  Resp: 16 16 16 16   Temp: 98.9 F (37.2 C) 98.3 F (36.8 C) 99 F (37.2 C) 98.2 F (36.8 C)  TempSrc: Oral Oral Oral Oral  SpO2: 94% 95% 94% 94%  Weight:   55.4 kg (122 lb 2.2 oz)   Height:        General: Pt is awake, no distress.   Cardiovascular: Rate controlled, S1/S2 + Respiratory: Bilateral decreased breath sounds at bases  abdominal: Soft, NT, ND, bowel sounds + Extremities: no edema, no cyanosis    The results of significant diagnostics from this hospitalization (including imaging, microbiology, ancillary and laboratory) are listed below for reference.     Microbiology: Recent Results (from the past 240 hour(s))  Culture, blood (Routine x 2)     Status: None (Preliminary result)   Collection Time: 10/31/17  6:15 PM  Result Value Ref Range Status   Specimen Description   Final    BLOOD LEFT ANTECUBITAL Performed at [facility], [address] [place], [place] [address]    Special Requests   Final    BOTTLES DRAWN AEROBIC AND ANAEROBIC Blood Culture adequate volume Performed at [facility], [address] [place], [place] [address]    Culture   Final    NO GROWTH 2 DAYS Performed at  [facility], [address] [place], [place] [address]    Report Status PENDING  Incomplete  Culture, blood (Routine x 2)     Status: None (Preliminary result)   Collection Time: 10/31/17  6:23 PM  Result Value Ref Range Status   Specimen Description   Final    BLOOD BLOOD RIGHT WRIST Performed at [facility], [address] [place], [place] [address]    Special Requests   Final    BOTTLES DRAWN AEROBIC AND ANAEROBIC Blood Culture adequate volume Performed at [facility], [address] [place], [place] [address]    Culture    Final    NO GROWTH 2 DAYS Performed at [facility], [address] [place], [place] [address]    Report Status PENDING  Incomplete  MRSA PCR Screening     Status: None   Collection Time: 11/01/17  1:52 AM  Result Value Ref Range Status   MRSA by PCR NEGATIVE NEGATIVE Final    Comment:        The GeneXpert MRSA Assay (FDA approved for NASAL specimens only), is one component of a comprehensive MRSA colonization surveillance program. It is not intended to diagnose MRSA infection nor to guide or monitor treatment for MRSA infections. Performed at [facility], [address], [place], [place] [address]      Labs: BNP (last 3 results) No results for input(s): BNP in the last 8760 hours. Basic Metabolic Panel: Recent Labs  Lab 10/31/17 1823 11/01/17 0126 11/02/17 0449 11/03/17 0703 11/04/17 0420  NA 137 139 140 141 138  K 3.3* 3.4* 3.9 3.8 3.1*  CL 103 109 110 109 106  CO2 21* 19* 22 22 19*  GLUCOSE 119* 109* 108* 86 86  BUN 17 14 16 7  5*  CREATININE 1.21* 1.30* 0.98 0.89 0.89  CALCIUM 9.4 8.3* 8.1* 8.2* 8.3*  MG  --  1.3* 2.4 2.0 1.9  PHOS  --  2.9 2.6  --   --    Liver Function Tests: Recent Labs  Lab 10/31/17 1823 11/01/17 0126  AST 36 24  ALT 12* 13*  ALKPHOS 48 45  BILITOT 0.8 0.5  PROT 7.0 5.6*  ALBUMIN 3.4* 2.7*   No results for input(s): LIPASE, AMYLASE in the last 168 hours. No results for input(s): AMMONIA in the last 168 hours. CBC: Recent Labs  Lab 10/31/17 1823 11/01/17 0126 11/02/17 0449 11/03/17 0703 11/04/17 0420  WBC 13.8* 28.3* 36.1* 17.7* 9.4  NEUTROABS 13.3* 25.8*  --   --  7.1  HGB 10.8* 9.1* 9.2* 9.7* 10.9*  HCT 32.7* 29.1* 28.2* 31.7* 33.9*  MCV 86.5 86.1 86.8 86.4 85.4  PLT 312 232 237 257 300   Cardiac Enzymes: Recent Labs  Lab 11/01/17 0126  TROPONINI <0.03   BNP: Invalid input(s): POCBNP CBG: Recent Labs  Lab 11/03/17 0817 11/03/17 1217 11/03/17 1739 11/03/17 2214 11/04/17 0732  GLUCAP 77 82  83 93 82   D-Dimer No results for input(s): DDIMER in the last 72 hours. Hgb A1c No results for input(s): HGBA1C in the last 72 hours. Lipid Profile No results for input(s): CHOL, HDL, LDLCALC, TRIG, CHOLHDL, LDLDIRECT in the last 72 hours. Thyroid function studies No results for input(s): TSH, T4TOTAL, T3FREE, THYROIDAB in the last 72 hours.  Invalid input(s): FREET3 Anemia work up No results for input(s): VITAMINB12, FOLATE, FERRITIN, TIBC, IRON, RETICCTPCT in the last 72 hours. Urinalysis  Component Value Date/Time   COLORURINE STRAW (A) 11/01/2017 0136   APPEARANCEUR CLEAR 11/01/2017 0136   LABSPEC 1.006 11/01/2017 0136   PHURINE 7.0 11/01/2017 0136   GLUCOSEU NEGATIVE 11/01/2017 0136   HGBUR MODERATE (A) 11/01/2017 0136   BILIRUBINUR NEGATIVE 11/01/2017 0136   KETONESUR NEGATIVE 11/01/2017 0136   PROTEINUR NEGATIVE 11/01/2017 0136   NITRITE NEGATIVE 11/01/2017 0136   LEUKOCYTESUR MODERATE (A) 11/01/2017 0136   Sepsis Labs Invalid input(s): PROCALCITONIN,  WBC,  LACTICIDVEN Microbiology Recent Results (from the past 240 hour(s))  Culture, blood (Routine x 2)     Status: None (Preliminary result)   Collection Time: 10/31/17  6:15 PM  Result Value Ref Range Status   Specimen Description   Final    BLOOD LEFT ANTECUBITAL Performed at [facility], [address] [place], [place] [address]    Special Requests   Final    BOTTLES DRAWN AEROBIC AND ANAEROBIC Blood Culture adequate volume Performed at [facility], [address] [place], [place] [address]    Culture   Final    NO GROWTH 2 DAYS Performed at [facility], [address] [place], [place] [address]    Report Status PENDING  Incomplete  Culture, blood (Routine x 2)     Status: None (Preliminary result)   Collection Time: 10/31/17  6:23 PM  Result Value Ref Range Status   Specimen Description   Final    BLOOD BLOOD RIGHT WRIST Performed at [facility], [address] [place], [place] [address]    Special Requests   Final    BOTTLES DRAWN AEROBIC AND ANAEROBIC Blood Culture adequate volume Performed at [facility], [address] [place], [place] [address]    Culture   Final    NO GROWTH 2 DAYS Performed at [facility], [address] [place], [place] [address]    Report Status PENDING  Incomplete  MRSA PCR Screening     Status: None   Collection Time: 11/01/17  1:52 AM  Result Value Ref Range Status   MRSA by PCR NEGATIVE NEGATIVE Final    Comment:        The GeneXpert MRSA Assay (FDA approved for NASAL specimens only), is one component of a comprehensive MRSA colonization surveillance program. It is not intended to diagnose MRSA infection nor to guide or monitor treatment for MRSA infections. Performed at [facility], [address], [place], [place] [address]      Time coordinating discharge: 35 minutes  SIGNED:   Glade Lloyd, MD  [facility] 11/04/2017, 8:50 AM Pager: [phone]  If 7PM-7AM, please contact night-coverage [email or web address] Password [email or web address]

## 2017-11-04 NOTE — Progress Notes (Signed)
Patient Discharge: Disposition: Patient discharged to home with sister. Education: Reviewed medications, prescriptions, follow-up appointments and discharge instructions with sister and verbalized understanding. IV: Discontinued peripheral IV x 3, site clean. Transportation: Patient escorted out of the unit in w/c accompanied by the sister. Belongings: Patient took all her belongings with her.

## 2017-11-04 NOTE — Progress Notes (Signed)
Physical Therapy Treatment Patient Details Name: Christina Everett MRN: [id number] DOB: 1956-04-18 Today's Date: 11/04/2017    History of Present Illness Pt is a 62 y/o female admitted secondary to septic shock and hypotension. Imaging revealed obstructing stone in L ureter. Pt is s/p L cystoscopy with stent placement. PMH includes mental disability.     PT Comments    Patient progressing with mobility and no longer seems unsteady or like she would need a walker.  Feel she is likely close to her baseline but could benefit from follow up HHPT for safety eval and higher level balance activities.  Likely to d/c today with family support and follow up HHPT.   Follow Up Recommendations  Home health PT     Equipment Recommendations  None recommended by PT    Recommendations for Other Services       Precautions / Restrictions Precautions Precautions: Fall Restrictions Weight Bearing Restrictions: No    Mobility  Bed Mobility Overal bed mobility: Modified Independent             General bed mobility comments: up in recliner after OT  Transfers Overall transfer level: Needs assistance Equipment used: None Transfers: Sit to/from Stand Sit to Stand: Supervision         General transfer comment: up from recliner, assist for safety  Ambulation/Gait Ambulation/Gait assistance: Supervision Ambulation Distance (Feet): 200 Feet Assistive device: None Gait Pattern/deviations: Step-through pattern;Decreased stride length     General Gait Details: able to turn in room to talk with OT while walking without LOB, in hallway did veer to side with head turns, but no LOB, steady without AD   Stairs Stairs: Yes   Stair Management: No rails;Forwards Number of Stairs: 2 General stair comments: hand hold assist  Wheelchair Mobility    Modified Rankin (Stroke Patients Only)       Balance Overall balance assessment: Needs assistance Sitting-balance support: No upper  extremity supported;Feet supported Sitting balance-Leahy Scale: Good     Standing balance support: No upper extremity supported Standing balance-Leahy Scale: Good                              Cognition Arousal/Alertness: Awake/alert Behavior During Therapy: WFL for tasks assessed/performed Overall Cognitive Status: History of cognitive impairments - at baseline                                        Exercises      General Comments General comments (skin integrity, edema, etc.): States her teeth are at home and had to be fixed      Pertinent Vitals/Pain Pain Assessment: No/denies pain    Home Living Family/patient expects to be discharged to:: Private residence Living Arrangements: Other relatives(sister) Available Help at Discharge: Family;Available 24 hours/day Type of Home: Mobile home Home Access: Stairs to enter Entrance Stairs-Rails: None Home Layout: One level Home Equipment: Environmental consultant - 2 wheels      Prior Function Level of Independence: Independent          PT Goals (current goals can now be found in the care plan section) Acute Rehab PT Goals Patient Stated Goal: to go home  Progress towards PT goals: Progressing toward goals    Frequency    Min 3X/week      PT Plan Equipment recommendations need to be updated;Current plan remains appropriate  Co-evaluation              AM-PAC PT "6 Clicks" Daily Activity  Outcome Measure  Difficulty turning over in bed (including adjusting bedclothes, sheets and blankets)?: A Little Difficulty moving from lying on back to sitting on the side of the bed? : A Little Difficulty sitting down on and standing up from a chair with arms (e.g., wheelchair, bedside commode, etc,.)?: A Little Help needed moving to and from a bed to chair (including a wheelchair)?: A Little Help needed walking in hospital room?: A Little Help needed climbing 3-5 steps with a railing? : A Little 6 Click  Score: 18    End of Session Equipment Utilized During Treatment: Gait belt Activity Tolerance: Patient tolerated treatment well Patient left: with call bell/phone within reach;in chair;with chair alarm set   PT Visit Diagnosis: Unsteadiness on feet (R26.81);Muscle weakness (generalized) (M62.81)     Time: [id number] PT Time Calculation (min) (ACUTE ONLY): 12 min  Charges:  $Gait Training: 8-22 mins                    G CodesSheran Lawless:       Cyndi Wynn, [place] [phone] 11/04/2017    Elray Mcgregorynthia Wynn 11/04/2017, 12:20 PM

## 2017-11-06 LAB — CULTURE, BLOOD (ROUTINE X 2)
Culture: NO GROWTH
Culture: NO GROWTH
SPECIAL REQUESTS: ADEQUATE
SPECIAL REQUESTS: ADEQUATE

## 2017-12-15 ENCOUNTER — Ambulatory Visit: Payer: Self-pay | Admitting: Family Medicine

## 2018-06-02 ENCOUNTER — Other Ambulatory Visit: Payer: Self-pay | Admitting: Urology

## 2018-06-20 NOTE — Progress Notes (Signed)
ekg 10-31-17 epic

## 2018-06-20 NOTE — Patient Instructions (Signed)
Christina BarthelShirley Everett  06/20/2018   Your procedure is scheduled on: 06-29-18   [facility]     Report to admitting at 11:00AM    Call this number if you have problems the morning of surgery [phone]     Remember: Do not eat food After Midnight. YOU MAY HAVE CLEAR LIQUIDS FROM MIDNIGHT UNTIL 7:00AM. NOTHING BY MOUTH AFTER 7:00AM!  BRUSH YOUR TEETH MORNING OF SURGERY AND RINSE YOUR MOUTH OUT, NO CHEWING GUM CANDY OR MINTS.     CLEAR LIQUID DIET   Foods Allowed                                                                     Foods Excluded  Coffee and tea, regular and decaf                             liquids that you cannot  Plain Jell-O in any flavor                                             see through such as: Fruit ices (not with fruit pulp)                                     milk, soups, orange juice  Iced Popsicles                                    All solid food Carbonated beverages, regular and diet                                    Cranberry, grape and apple juices Sports drinks like Gatorade Lightly seasoned clear broth or consume(fat free) Sugar, honey syrup  Sample Menu Breakfast                                Lunch                                     Supper Cranberry juice                    Beef broth                            Chicken broth Jell-O                                     Grape juice  Apple juice Coffee or tea                        Jell-O                                      Popsicle                                                Coffee or tea                        Coffee or tea  _____________________________________________________________________       Take these medicines the morning of surgery with A SIP OF WATER: tylenol if needed                                You may not have any metal on your body including hair pins and              piercings  Do not wear  jewelry, make-up, lotions, powders or perfumes, deodorant             Do not wear nail polish.  Do not shave  48 hours prior to surgery.           Do not bring valuables to the hospital. [facility] IS NOT             RESPONSIBLE   FOR VALUABLES.  Contacts, dentures or bridgework may not be worn into surgery.       Patients discharged the day of surgery will not be allowed to drive home.  Name and phone number of your driver:  Special Instructions: N/A              Please read over the following fact sheets you were given: _____________________________________________________________________             [facility] ([facility]) - Preparing for Surgery Before surgery, you can play an important role.  Because skin is not sterile, your skin needs to be as free of germs as possible.  You can reduce the number of germs on your skin by washing with CHG (chlorahexidine gluconate) soap before surgery.  CHG is an antiseptic cleaner which kills germs and bonds with the skin to continue killing germs even after washing. Please DO NOT use if you have an allergy to CHG or antibacterial soaps.  If your skin becomes reddened/irritated stop using the CHG and inform your nurse when you arrive at Short Stay. Do not shave (including legs and underarms) for at least 48 hours prior to the first CHG shower.  You may shave your face/neck. Please follow these instructions carefully:  1.  Shower with CHG Soap the night before surgery and the  morning of Surgery.  2.  If you choose to wash your hair, wash your hair first as usual with your  normal  shampoo.  3.  After you shampoo, rinse your hair and body thoroughly to remove the  shampoo.                           4.  Use CHG  as you would any other liquid soap.  You can apply chg directly  to the skin and wash                       Gently with a scrungie or clean washcloth.  5.  Apply the CHG Soap to your body ONLY FROM THE NECK DOWN.   Do not use on face/ open                            Wound or open sores. Avoid contact with eyes, ears mouth and genitals (private parts).                       Wash face,  Genitals (private parts) with your normal soap.             6.  Wash thoroughly, paying special attention to the area where your surgery  will be performed.  7.  Thoroughly rinse your body with warm water from the neck down.  8.  DO NOT shower/wash with your normal soap after using and rinsing off  the CHG Soap.                9.  Pat yourself dry with a clean towel.            10.  Wear clean pajamas.            11.  Place clean sheets on your bed the night of your first shower and do not  sleep with pets. Day of Surgery : Do not apply any lotions/deodorants the morning of surgery.  Please wear clean clothes to the hospital/surgery center.  FAILURE TO FOLLOW THESE INSTRUCTIONS MAY RESULT IN THE CANCELLATION OF YOUR SURGERY PATIENT SIGNATURE_________________________________  NURSE SIGNATURE__________________________________  ________________________________________________________________________

## 2018-06-21 ENCOUNTER — Other Ambulatory Visit: Payer: Self-pay

## 2018-06-21 ENCOUNTER — Encounter (HOSPITAL_COMMUNITY): Payer: Self-pay

## 2018-06-21 ENCOUNTER — Encounter (HOSPITAL_COMMUNITY)
Admission: RE | Admit: 2018-06-21 | Discharge: 2018-06-21 | Disposition: A | Discharge status: Home / Self Care | Payer: Self-pay | Source: Ambulatory Visit | Attending: Urology | Admitting: Urology

## 2018-06-21 DIAGNOSIS — N200 Calculus of kidney: Secondary | ICD-10-CM | POA: Insufficient documentation

## 2018-06-21 DIAGNOSIS — Z01812 Encounter for preprocedural laboratory examination: Secondary | ICD-10-CM | POA: Insufficient documentation

## 2018-06-21 HISTORY — DX: Sepsis, unspecified organism: A41.9

## 2018-06-21 HISTORY — DX: Aneurysm of the ascending aorta, without rupture: I71.21

## 2018-06-21 HISTORY — DX: Calculus of kidney: N20.0

## 2018-06-21 HISTORY — DX: Thoracic aortic aneurysm, without rupture: I71.2

## 2018-06-21 LAB — BASIC METABOLIC PANEL
Anion gap: 8 (ref 5–15)
BUN: 17 mg/dL (ref 8–23)
CO2: 26 mmol/L (ref 22–32)
Calcium: 9.4 mg/dL (ref 8.9–10.3)
Chloride: 106 mmol/L (ref 98–111)
Creatinine, Ser: 1.07 mg/dL — ABNORMAL HIGH (ref 0.44–1.00)
GFR calc Af Amer: >60 mL/min (ref >60)
GFR, EST NON AFRICAN AMERICAN: 55 mL/min — AB (ref >60)
Glucose, Bld: 108 mg/dL — ABNORMAL HIGH (ref 70–99)
POTASSIUM: 4.3 mmol/L (ref 3.5–5.1)
SODIUM: 140 mmol/L (ref 135–145)

## 2018-06-21 LAB — CBC
HCT: 40.8 % (ref 36.0–46.0)
HEMOGLOBIN: 12.5 g/dL (ref 12.0–15.0)
MCH: 26.9 pg (ref 26.0–34.0)
MCHC: 30.6 g/dL (ref 30.0–36.0)
MCV: 87.9 fL (ref 80.0–100.0)
Platelets: 494 10*3/uL — ABNORMAL HIGH (ref 150–400)
RBC: 4.64 MIL/uL (ref 3.87–5.11)
RDW: 13.1 % (ref 11.5–15.5)
WBC: 10.1 10*3/uL (ref 4.0–10.5)
nRBC: 0 % (ref 0.0–0.2)

## 2018-06-21 NOTE — Progress Notes (Signed)
See CT chest 10-31-17 abnormal. RN spoke with anesthesia Dr Okey Dupreose regarding the following that was mentioned on T chest interpretation : "4.3 cm diameter ascending thoracic aortic aneurysm. Recommend annual imaging followup by CTA or MRA". Per Dr Okey Dupreose, patient okay to proceed with surgery as scheduled; patient  needs to f/u with recommendation for annual monitoring .

## 2018-06-22 ENCOUNTER — Encounter (HOSPITAL_COMMUNITY): Payer: Self-pay

## 2018-06-22 NOTE — Progress Notes (Signed)
RN lvmm for Christina Everett (scheduler for Dr Marlou PorchHerrick) to make aware that at patient pre-op yesterday, her sister/guradian Kendal HymenBonnie was unaware of patient's dx of thoracic aortic dilation of 4.3cm (please see last CT chest in epic) and says that patient never f/u wirh cardiology although recommendation for cards f/u was noted in discharge summary from 10/2017

## 2018-06-29 ENCOUNTER — Ambulatory Visit (HOSPITAL_COMMUNITY): Payer: Self-pay | Admitting: Certified Registered Nurse Anesthetist

## 2018-06-29 ENCOUNTER — Encounter (HOSPITAL_COMMUNITY)
Admission: RE | Disposition: A | Discharge status: Home / Self Care | Payer: Self-pay | Source: Ambulatory Visit | Attending: Urology

## 2018-06-29 ENCOUNTER — Ambulatory Visit (HOSPITAL_COMMUNITY)
Admission: RE | Admit: 2018-06-29 | Discharge: 2018-06-29 | Disposition: A | Discharge status: Home / Self Care | Payer: Self-pay | Source: Ambulatory Visit | Attending: Urology | Admitting: Urology

## 2018-06-29 ENCOUNTER — Ambulatory Visit (HOSPITAL_COMMUNITY): Payer: Self-pay

## 2018-06-29 ENCOUNTER — Encounter (HOSPITAL_COMMUNITY): Payer: Self-pay

## 2018-06-29 DIAGNOSIS — Z8042 Family history of malignant neoplasm of prostate: Secondary | ICD-10-CM | POA: Insufficient documentation

## 2018-06-29 DIAGNOSIS — F419 Anxiety disorder, unspecified: Secondary | ICD-10-CM | POA: Insufficient documentation

## 2018-06-29 DIAGNOSIS — Z87442 Personal history of urinary calculi: Secondary | ICD-10-CM | POA: Insufficient documentation

## 2018-06-29 DIAGNOSIS — I2510 Atherosclerotic heart disease of native coronary artery without angina pectoris: Secondary | ICD-10-CM | POA: Insufficient documentation

## 2018-06-29 DIAGNOSIS — F329 Major depressive disorder, single episode, unspecified: Secondary | ICD-10-CM | POA: Insufficient documentation

## 2018-06-29 DIAGNOSIS — F17200 Nicotine dependence, unspecified, uncomplicated: Secondary | ICD-10-CM | POA: Insufficient documentation

## 2018-06-29 DIAGNOSIS — N210 Calculus in bladder: Secondary | ICD-10-CM | POA: Insufficient documentation

## 2018-06-29 DIAGNOSIS — I719 Aortic aneurysm of unspecified site, without rupture: Secondary | ICD-10-CM | POA: Insufficient documentation

## 2018-06-29 DIAGNOSIS — N136 Pyonephrosis: Secondary | ICD-10-CM | POA: Insufficient documentation

## 2018-06-29 HISTORY — PX: CYSTOSCOPY/URETEROSCOPY/HOLMIUM LASER/STENT PLACEMENT: SHX6546

## 2018-06-29 SURGERY — CYSTOSCOPY/URETEROSCOPY/HOLMIUM LASER/STENT PLACEMENT
Anesthesia: General | Laterality: Left

## 2018-06-29 MED ORDER — ONDANSETRON HCL 4 MG/2ML IJ SOLN
INTRAMUSCULAR | Status: AC
  Filled 2018-06-29: qty 2

## 2018-06-29 MED ORDER — FENTANYL CITRATE (PF) 100 MCG/2ML IJ SOLN
INTRAMUSCULAR | Status: AC
  Filled 2018-06-29: qty 2

## 2018-06-29 MED ORDER — GENTAMICIN SULFATE 40 MG/ML IJ SOLN
5.0000 mg/kg | INTRAVENOUS | Status: AC
  Administered 2018-06-29: 250 mg via INTRAVENOUS
  Filled 2018-06-29: qty 6.25

## 2018-06-29 MED ORDER — IOHEXOL 300 MG/ML  SOLN
INTRAMUSCULAR | Status: DC | PRN
  Administered 2018-06-29: 20 mL via URETHRAL

## 2018-06-29 MED ORDER — 0.9 % SODIUM CHLORIDE (POUR BTL) OPTIME
TOPICAL | Status: DC | PRN
  Administered 2018-06-29: 1000 mL

## 2018-06-29 MED ORDER — DEXAMETHASONE SODIUM PHOSPHATE 10 MG/ML IJ SOLN
INTRAMUSCULAR | Status: DC | PRN
  Administered 2018-06-29: 10 mg via INTRAVENOUS

## 2018-06-29 MED ORDER — TRAMADOL HCL 50 MG PO TABS: 50.0000 mg | ORAL_TABLET | Freq: Four times a day (QID) | ORAL | 0 refills | Status: DC | PRN

## 2018-06-29 MED ORDER — FENTANYL CITRATE (PF) 100 MCG/2ML IJ SOLN
INTRAMUSCULAR | Status: DC | PRN
  Administered 2018-06-29 (×2): 25 ug via INTRAVENOUS

## 2018-06-29 MED ORDER — FENTANYL CITRATE (PF) 100 MCG/2ML IJ SOLN: 25.0000 ug | INTRAMUSCULAR | Status: DC | PRN

## 2018-06-29 MED ORDER — DEXAMETHASONE SODIUM PHOSPHATE 10 MG/ML IJ SOLN
INTRAMUSCULAR | Status: AC
  Filled 2018-06-29: qty 1

## 2018-06-29 MED ORDER — LIDOCAINE 2% (20 MG/ML) 5 ML SYRINGE
INTRAMUSCULAR | Status: AC
  Filled 2018-06-29: qty 5

## 2018-06-29 MED ORDER — SODIUM CHLORIDE 0.9 % IR SOLN
Status: DC | PRN
  Administered 2018-06-29: 9000 mL via INTRAVESICAL

## 2018-06-29 MED ORDER — LIDOCAINE 2% (20 MG/ML) 5 ML SYRINGE
INTRAMUSCULAR | Status: DC | PRN
  Administered 2018-06-29: 50 mg via INTRAVENOUS

## 2018-06-29 MED ORDER — EPHEDRINE SULFATE-NACL 50-0.9 MG/10ML-% IV SOSY
PREFILLED_SYRINGE | INTRAVENOUS | Status: DC | PRN
  Administered 2018-06-29: 10 mg via INTRAVENOUS
  Administered 2018-06-29 (×2): 5 mg via INTRAVENOUS

## 2018-06-29 MED ORDER — PROPOFOL 10 MG/ML IV BOLUS
INTRAVENOUS | Status: AC
  Filled 2018-06-29: qty 20

## 2018-06-29 MED ORDER — PROPOFOL 10 MG/ML IV BOLUS
INTRAVENOUS | Status: DC | PRN
  Administered 2018-06-29: 100 mg via INTRAVENOUS

## 2018-06-29 MED ORDER — PHENAZOPYRIDINE HCL 200 MG PO TABS: 200.0000 mg | ORAL_TABLET | Freq: Three times a day (TID) | ORAL | 0 refills | Status: DC | PRN

## 2018-06-29 MED ORDER — ONDANSETRON HCL 4 MG/2ML IJ SOLN
INTRAMUSCULAR | Status: DC | PRN
  Administered 2018-06-29: 4 mg via INTRAVENOUS

## 2018-06-29 MED ORDER — LACTATED RINGERS IV SOLN
INTRAVENOUS | Status: DC
  Administered 2018-06-29: 1000 mL via INTRAVENOUS

## 2018-06-29 MED ORDER — EPHEDRINE 5 MG/ML INJ
INTRAVENOUS | Status: AC
  Filled 2018-06-29: qty 10

## 2018-06-29 MED ORDER — ACETAMINOPHEN 500 MG PO TABS
ORAL_TABLET | ORAL | Status: AC
  Filled 2018-06-29: qty 2

## 2018-06-29 SURGICAL SUPPLY — 23 items
BAG URO CATCHER STRL LF (MISCELLANEOUS) ×3 IMPLANT
BASKET ZERO TIP NITINOL 2.4FR (BASKET) IMPLANT
CATH URET 5FR 28IN OPEN ENDED (CATHETERS) ×3 IMPLANT
CLOTH BEACON ORANGE TIMEOUT ST (SAFETY) ×6 IMPLANT
COVER WAND RF STERILE (DRAPES) IMPLANT
EXTRACTOR STONE 1.7FRX115CM (UROLOGICAL SUPPLIES) IMPLANT
FIBER LASER FLEXIVA 1000 (UROLOGICAL SUPPLIES) ×3 IMPLANT
FIBER LASER TRAC TIP (UROLOGICAL SUPPLIES) ×3 IMPLANT
GLOVE BIOGEL M STRL SZ7.5 (GLOVE) ×3 IMPLANT
GOWN STRL REUS W/TWL XL LVL3 (GOWN DISPOSABLE) ×3 IMPLANT
GUIDEWIRE ANG ZIPWIRE 038X150 (WIRE) ×3 IMPLANT
GUIDEWIRE STR DUAL SENSOR (WIRE) ×6 IMPLANT
MANIFOLD NEPTUNE II (INSTRUMENTS) ×3 IMPLANT
PACK CYSTO (CUSTOM PROCEDURE TRAY) ×3 IMPLANT
PROTECTOR NERVE ULNAR (MISCELLANEOUS) ×3 IMPLANT
SHEATH URETERAL 12FRX28CM (UROLOGICAL SUPPLIES) IMPLANT
SHEATH URETERAL 12FRX35CM (MISCELLANEOUS) ×3 IMPLANT
STENT CONTOUR 7FRX26X.038 (STENTS) ×3 IMPLANT
SYRINGE IRR TOOMEY STRL 70CC (SYRINGE) ×3 IMPLANT
TUBING CONNECTING 10 (TUBING) ×2 IMPLANT
TUBING CONNECTING 10' (TUBING) ×1
TUBING UROLOGY SET (TUBING) ×3 IMPLANT
WIRE COONS/BENSON .038X145CM (WIRE) IMPLANT

## 2018-06-29 NOTE — Transfer of Care (Signed)
Immediate Anesthesia Transfer of Care Note  Patient: Christina Everett  Procedure(s) Performed: CYSTOSCOPY WITH LITHOPAXY, LEFT RETROGRADE PYELOGRAM/HOLMIUM LASER/STENT EXCHANGE (Left )  Patient Location: PACU  Anesthesia Type:General  Level of Consciousness: awake, alert  and oriented  Airway & Oxygen Therapy: Patient Spontanous Breathing and Patient connected to face mask oxygen  Post-op Assessment: Report given to RN and Post -op Vital signs reviewed and stable  Post vital signs: Reviewed and stable  Last Vitals:  Vitals Value Taken Time  BP 137/90 06/29/2018  3:34 PM  Temp    Pulse 109 06/29/2018  3:40 PM  Resp 22 06/29/2018  3:40 PM  SpO2 98 % 06/29/2018  3:40 PM  Vitals shown include unvalidated device data.  Last Pain:  Vitals:   06/29/18 1213  TempSrc:   PainSc: 0-No pain      Patients Stated Pain Goal: 4 (06/29/18 1213)  Complications: none

## 2018-06-29 NOTE — Progress Notes (Signed)
Pacu Phase II Pt   With sister at bedside waiting for their ride.

## 2018-06-29 NOTE — Anesthesia Procedure Notes (Signed)
Procedure Name: LMA Insertion Date/Time: 06/29/2018 2:24 PM Performed by: Orest DikesPeters, Kolby Myung J, CRNA Pre-anesthesia Checklist: Patient identified, Emergency Drugs available, Suction available and Patient being monitored Patient Re-evaluated:Patient Re-evaluated prior to induction Oxygen Delivery Method: Circle system utilized Preoxygenation: Pre-oxygenation with 100% oxygen Induction Type: IV induction LMA: LMA inserted LMA Size: 4.0 Number of attempts: 1 Placement Confirmation: breath sounds checked- equal and bilateral and positive ETCO2 Tube secured with: Tape Dental Injury: Teeth and Oropharynx as per pre-operative assessment

## 2018-06-29 NOTE — H&P (View-Only) (Signed)
Patient seen today in follow-up. On April 7th, she was seen in the ED for fevers of upto 104 and minimal complaints. Fever w/u demonstrated a 1.2 cm left obstructing stone. She was stented and improved. She has not followed up.   General: The patient presents today, looking great. She is done well despite the fact that she's had a stent in for now 6 months. She has not been treated for any infection spirits she is having some urinary urgency and incontinence. No fevers or chills.     ALLERGIES: None   MEDICATIONS: None   GU PSH: Cystoscopy Insert Stent, Left - 10/31/2017    NON-GU PSH: None   GU PMH: None   NON-GU PMH: Anxiety Depression    FAMILY HISTORY: Cancer - Mother Dementia - Mother prostate cancer in father - Father    Notes: 0 children   SOCIAL HISTORY: Marital Status: Single Preferred Language: English; Ethnicity: Not Hispanic Or Latino; Race: White Current Smoking Status: Patient smokes. Smokes 1/2 pack per day.   Tobacco Use Assessment Completed: Used Tobacco in last 30 days? Drinks 4+ caffeinated drinks per day.    REVIEW OF SYSTEMS:    GU Review Female:   Patient reports frequent urination, get up at night to urinate, and trouble starting your stream. Patient denies hard to postpone urination, burning /pain with urination, leakage of urine, stream starts and stops, have to strain to urinate, and being pregnant.  Gastrointestinal (Upper):   Patient reports indigestion/ heartburn. Patient denies nausea and vomiting.  Gastrointestinal (Lower):   Patient reports diarrhea and constipation.   Constitutional:   Patient reports weight loss. Patient denies fever, night sweats, and fatigue.  Skin:   Patient denies skin rash/ lesion and itching.  Eyes:   Patient denies blurred vision and double vision.  Ears/ Nose/ Throat:   Patient denies sore throat and sinus problems.  Hematologic/Lymphatic:   Patient reports easy bruising. Patient denies swollen glands.  Cardiovascular:    Patient denies leg swelling and chest pains.  Respiratory:   Patient reports cough. Patient denies shortness of breath.  Endocrine:   Patient denies excessive thirst.  Musculoskeletal:   Patient denies back pain and joint pain.  Neurological:   Patient denies headaches and dizziness.  Psychologic:   Patient reports depression and anxiety.    VITAL SIGNS:      05/30/2018 10:35 AM  Weight 115 lb / 52.16 kg  Height 65 in / 165.1 cm  BP 133/75 mmHg  Pulse 100 /min  Temperature 97.0 F / 36.1 C  BMI 19.1 kg/m   MULTI-SYSTEM PHYSICAL EXAMINATION:    Constitutional: Well-nourished. No physical deformities. Normally developed. Good grooming.  Respiratory: Normal breath sounds. No labored breathing, no use of accessory muscles.   Cardiovascular: Regular rate and rhythm. No murmur, no gallop. Normal temperature, normal extremity pulses, no swelling, no varicosities.   Gastrointestinal: No mass, no tenderness, no rigidity, non obese abdomen.     PAST DATA REVIEWED:  Source Of History:  Patient  Records Review:   Previous Hospital Records, Previous Patient Records, POC Tool  Urine Test Review:   Urinalysis  X-Ray Review: C.T. Abdomen/Pelvis: Reviewed Films. Discussed With Patient.     PROCEDURES:          Urinalysis w/Scope Dipstick Dipstick Cont'd Micro  Color: Yellow Bilirubin: Neg mg/dL WBC/hpf: >40/JWJ>60/hpf  Appearance: Cloudy Ketones: Neg mg/dL RBC/hpf: NS (Not Seen)  Specific Gravity: 1.015 Blood: 1+ ery/uL Bacteria: Many (>50/hpf)  pH: 7.0 Protein: 1+ mg/dL  Cystals: NS (Not Seen)  Glucose: Neg mg/dL Urobilinogen: 0.2 mg/dL Casts: NS (Not Seen)    Nitrites: Neg Trichomonas: Not Present    Leukocyte Esterase: 3+ leu/uL Mucous: Not Present      Epithelial Cells: 0 - 5/hpf      Yeast: NS (Not Seen)      Sperm: Not Present    ASSESSMENT:      ICD-10 Details  1 GU:   Acute Cystitis/UTI - N30.00   2   Renal and ureteral calculus - N20.2    PLAN:           Orders Labs Urine  Culture          Schedule Return Visit/Planned Activity: ASAP - Schedule Surgery          Document Letter(s):  Created for Patient: Clinical Summary         Notes:   The patient has a 13 mm stone in the right UPJ and subsequent developed urosepsis. I stented her over 6 months ago. She was lost to follow-up and had transportation issues as well as other social issues preventing her from getting to the doctor. Today she presents with her sister who is her DPOAE as well as her niece. They are her social support. We discussed treatment options including PCNL versus staged ureteroscopy. I discussed within the potential complication of having an encrusted stent. We discussed numerous ureteroscopy versus trying to obtain stone free status after 1 PCNL. At this point, our plan is to proceed with ureteroscopy and as much as we can in one setting, and reschedule her if necessary for a staged procedure.

## 2018-06-29 NOTE — Op Note (Signed)
Preoperative diagnosis:  1. Left UPJ stone  Postoperative diagnosis:  1. Large bladder stone 2. Encrusted left ureteral stent with obstruction and hydronephrosis 3. Left UPJ stone  Procedure:  1. Cystoscopy 2. Cystolitholapaxy, 2.5 cm 3. Left retrograde pyelogram with interpretation 4. Left ureteral stent placement  Surgeon: Crist Fat, MD  Anesthesia: General  Complications: None  Intraoperative findings: The patient was noted to have a 2.5 cm bladder stone along the distal curl of the stent.  I was unable to remove the stent from the ureter because it appeared based on fluoroscopy that the patient also has a stone on the proximal end of the stent.  I did fragment and remove the bladder stone in the distal aspect of the stent.  I removed approximately 3 cm of the stent the remaining aspect of the stent is along the ureter and 2 cm coming out of the ureteral orifice.  I was able to place a 7 [place] stent alongside the encrusted stent with plans to perform a staged procedure to remove the encrusted stent and all the stones associated with that and a follow-up surgery.  Drains: 7 French left double-J ureteral stent  EBL: Minimal  Specimens: None  Indication: Christina Everett is a 62 y.o. patient with a history of a left infected proximal/UPJ stone.  A stent was placed at the time of her initial presentation.  She was lost to follow-up, was unable to make her doctors appointments for many months.  Ultimately we were able to get her into the office and counsel her about the remaining steps of the procedure.  Initially, our plan was to remove the stone and exchange the stent today.  However, the time of the operation became necessary to alter our plans and take care of the bladder stone first. After reviewing the management options for treatment, he elected to proceed with the above surgical procedure(s). We have discussed the potential benefits and risks of the procedure, side effects  of the proposed treatment, the likelihood of the patient achieving the goals of the procedure, and any potential problems that might occur during the procedure or recuperation. Informed consent has been obtained.  Description of procedure:  The patient was taken to the operating room and general anesthesia was induced.  The patient was placed in the dorsal lithotomy position, prepped and draped in the usual sterile fashion, and preoperative antibiotics were administered. A preoperative time-out was performed.   Cystourethroscopy was performed.  I quickly encountered a 2-1/2 cm stone that was encrusting the curl of the stent emanating from the patient's left ureteral orifice.  At this time I used a 1000 m laser fiber and set the laser settings to 0.5 J and 50 Hz.  I pulverize the stone and inserted a 26 French sheath to remove the fragments using a [place]y syringe.  Once the stone fragments had been removed I attempted to pull the patient's stent.  This was unsuccessful, I cannot get the stent past the UPJ.  Fluoroscopy demonstrated a radio opaque area along the distal aspect of the stone likely an encrusted stent.  At this point, I performed a retrograde pyelogram alongside the encrusted stent which demonstrated a very dilated proximal ureter as well as a blown out severely hydronephrotic left renal pelvis and proximal ureter.  I then advanced a 0.38 sensor wire up alongside the encrusted stent and then advanced a 7 [place] x26 cm double-J ureteral stent alongside the encrusted stent and up into the renal pelvis.  The wire was then removed the stent was successfully placed with a nice curl in the pelvis and the bladder demonstrated on fluoroscopy.  I then cut some of the encrusted stent off that was hanging out of the patient's urethra and put the rest of the stent back into her bladder.  The stent tether was taken off of the newly placed stent.  The bladder was then emptied and the procedure ended.  The  patient appeared to tolerate the procedure well and without complications.  The patient was able to be awakened and transferred to the recovery unit in satisfactory condition.   Disposition: The patient will need a second and possibly third ureteroscopy to remove the encrusted stent and the stones associated with it.  Crist FatBenjamin W. Vivienne Sangiovanni, M.D.

## 2018-06-29 NOTE — Discharge Instructions (Signed)
DISCHARGE INSTRUCTIONS FOR KIDNEY STONE/URETERAL STENT   MEDICATIONS:  1.  Resume all your other meds from home - except do not take any extra narcotic pain meds that you may have at home.  2. Pyridium is to help with the burning/stinging when you urinate. 3. Tramadol is for moderate/severe pain, otherwise taking upto 1000 mg every 6 hours of plainTylenol will help treat your pain.     ACTIVITY:  1. No strenuous activity x 1week  2. No driving while on narcotic pain medications  3. Drink plenty of water  4. Continue to walk at home - you can still get blood clots when you are at home, so keep active, but don't over do it.  5. May return to work/school tomorrow or when you feel ready   BATHING:  1. You can shower and we recommend daily showers  SIGNS/SYMPTOMS TO CALL:  Please call us if you have a fever greater than 101.5, uncontrolled nausea/vomiting, uncontrolled pain, dizziness, unable to urinate, bloody urine, chest pain, shortness of breath, leg swelling, leg pain, redness around wound, drainage from wound, or any other concerns or questions.   You can reach us at [phone].   FOLLOW-UP:  1. You will be scheduled for f/u surgery in the coming weeks.

## 2018-06-29 NOTE — H&P (Signed)
Patient seen today in follow-up. On April 7th, she was seen in the ED for fevers of upto 104 and minimal complaints. Fever w/u demonstrated a 1.2 cm left obstructing stone. She was stented and improved. She has not followed up.   General: The patient presents today, looking great. She is done well despite the fact that she's had a stent in for now 6 months. She has not been treated for any infection spirits she is having some urinary urgency and incontinence. No fevers or chills.     ALLERGIES: None   MEDICATIONS: None   GU PSH: Cystoscopy Insert Stent, Left - 10/31/2017    NON-GU PSH: None   GU PMH: None   NON-GU PMH: Anxiety Depression    FAMILY HISTORY: Cancer - Mother Dementia - Mother prostate cancer in father - Father    Notes: 0 children   SOCIAL HISTORY: Marital Status: Single Preferred Language: English; Ethnicity: Not Hispanic Or Latino; Race: White Current Smoking Status: Patient smokes. Smokes 1/2 pack per day.   Tobacco Use Assessment Completed: Used Tobacco in last 30 days? Drinks 4+ caffeinated drinks per day.    REVIEW OF SYSTEMS:    GU Review Female:   Patient reports frequent urination, get up at night to urinate, and trouble starting your stream. Patient denies hard to postpone urination, burning /pain with urination, leakage of urine, stream starts and stops, have to strain to urinate, and being pregnant.  Gastrointestinal (Upper):   Patient reports indigestion/ heartburn. Patient denies nausea and vomiting.  Gastrointestinal (Lower):   Patient reports diarrhea and constipation.   Constitutional:   Patient reports weight loss. Patient denies fever, night sweats, and fatigue.  Skin:   Patient denies skin rash/ lesion and itching.  Eyes:   Patient denies blurred vision and double vision.  Ears/ Nose/ Throat:   Patient denies sore throat and sinus problems.  Hematologic/Lymphatic:   Patient reports easy bruising. Patient denies swollen glands.  Cardiovascular:    Patient denies leg swelling and chest pains.  Respiratory:   Patient reports cough. Patient denies shortness of breath.  Endocrine:   Patient denies excessive thirst.  Musculoskeletal:   Patient denies back pain and joint pain.  Neurological:   Patient denies headaches and dizziness.  Psychologic:   Patient reports depression and anxiety.    VITAL SIGNS:      05/30/2018 10:35 AM  Weight 115 lb / 52.16 kg  Height 65 in / 165.1 cm  BP 133/75 mmHg  Pulse 100 /min  Temperature 97.0 F / 36.1 C  BMI 19.1 kg/m²   MULTI-SYSTEM PHYSICAL EXAMINATION:    Constitutional: Well-nourished. No physical deformities. Normally developed. Good grooming.  Respiratory: Normal breath sounds. No labored breathing, no use of accessory muscles.   Cardiovascular: Regular rate and rhythm. No murmur, no gallop. Normal temperature, normal extremity pulses, no swelling, no varicosities.   Gastrointestinal: No mass, no tenderness, no rigidity, non obese abdomen.     PAST DATA REVIEWED:  Source Of History:  Patient  Records Review:   Previous Hospital Records, Previous Patient Records, POC Tool  Urine Test Review:   Urinalysis  X-Ray Review: C.T. Abdomen/Pelvis: Reviewed Films. Discussed With Patient.     PROCEDURES:          Urinalysis w/Scope Dipstick Dipstick Cont'd Micro  Color: Yellow Bilirubin: Neg mg/dL WBC/hpf: >60/hpf  Appearance: Cloudy Ketones: Neg mg/dL RBC/hpf: NS (Not Seen)  Specific Gravity: 1.015 Blood: 1+ ery/uL Bacteria: Many (>50/hpf)  pH: 7.0 Protein: 1+ mg/dL   Cystals: NS (Not Seen)  Glucose: Neg mg/dL Urobilinogen: 0.2 mg/dL Casts: NS (Not Seen)    Nitrites: Neg Trichomonas: Not Present    Leukocyte Esterase: 3+ leu/uL Mucous: Not Present      Epithelial Cells: 0 - 5/hpf      Yeast: NS (Not Seen)      Sperm: Not Present    ASSESSMENT:      ICD-10 Details  1 GU:   Acute Cystitis/UTI - N30.00   2   Renal and ureteral calculus - N20.2    PLAN:           Orders Labs Urine  Culture          Schedule Return Visit/Planned Activity: ASAP - Schedule Surgery          Document Letter(s):  Created for Patient: Clinical Summary         Notes:   The patient has a 13 mm stone in the right UPJ and subsequent developed urosepsis. I stented her over 6 months ago. She was lost to follow-up and had transportation issues as well as other social issues preventing her from getting to the doctor. Today she presents with her sister who is her DPOAE as well as her niece. They are her social support. We discussed treatment options including PCNL versus staged ureteroscopy. I discussed within the potential complication of having an encrusted stent. We discussed numerous ureteroscopy versus trying to obtain stone free status after 1 PCNL. At this point, our plan is to proceed with ureteroscopy and as much as we can in one setting, and reschedule her if necessary for a staged procedure.

## 2018-06-29 NOTE — Anesthesia Preprocedure Evaluation (Addendum)
Anesthesia Evaluation  Patient identified by MRN, date of birth, ID band Patient awake    Reviewed: Allergy & Precautions, NPO status , Patient's Chart, lab work & pertinent test results  Airway Mallampati: II  TM Distance: >3 FB Neck ROM: Full    Dental no notable dental hx. (+) Edentulous Upper, Edentulous Lower, Dental Advisory Given   Pulmonary neg pulmonary ROS, Current Smoker,    Pulmonary exam normal breath sounds clear to auscultation       Cardiovascular + CAD (noted on chest CT 10/2017)  Normal cardiovascular exam Rhythm:Regular Rate:Normal  thoacic aortic aneurysm noted on chest CT 10/2017   Neuro/Psych negative neurological ROS  negative psych ROS   GI/Hepatic negative GI ROS, Neg liver ROS,   Endo/Other  negative endocrine ROS  Renal/GU negative Renal ROSLeft renal stone  negative genitourinary   Musculoskeletal negative musculoskeletal ROS (+)   Abdominal   Peds  Hematology negative hematology ROS (+)   Anesthesia Other Findings MR, sister is caretaker  Reproductive/Obstetrics                            Anesthesia Physical Anesthesia Plan  ASA: III  Anesthesia Plan: General   Post-op Pain Management:    Induction: Intravenous  PONV Risk Score and Plan: 2 and Ondansetron, Dexamethasone and Midazolam  Airway Management Planned: LMA  Additional Equipment:   Intra-op Plan:   Post-operative Plan: Extubation in OR  Informed Consent: I have reviewed the patients History and Physical, chart, labs and discussed the procedure including the risks, benefits and alternatives for the proposed anesthesia with the patient or authorized representative who has indicated his/her understanding and acceptance.   Dental advisory given  Plan Discussed with: CRNA  Anesthesia Plan Comments:        Anesthesia Quick Evaluation

## 2018-06-30 NOTE — Anesthesia Postprocedure Evaluation (Signed)
Anesthesia Post Note  Patient: Christina Everett  Procedure(s) Performed: CYSTOSCOPY WITH LITHOPAXY, LEFT RETROGRADE PYELOGRAM/HOLMIUM LASER/STENT EXCHANGE (Left )     Patient location during evaluation: PACU Anesthesia Type: General Level of consciousness: awake and alert Pain management: pain level controlled Vital Signs Assessment: post-procedure vital signs reviewed and stable Respiratory status: spontaneous breathing, nonlabored ventilation, respiratory function stable and patient connected to nasal cannula oxygen Cardiovascular status: blood pressure returned to baseline and stable Postop Assessment: no apparent nausea or vomiting Anesthetic complications: no    Last Vitals:  Vitals:   06/29/18 1630 06/29/18 1633  BP: 136/80   Pulse: 81   Resp: 18   Temp: 36.4 C 36.4 C  SpO2: 100%     Last Pain:  Vitals:   06/29/18 1630  TempSrc: Oral  PainSc: 0-No pain                 Demarie Hyneman L Chrisette Man

## 2018-07-01 ENCOUNTER — Encounter (HOSPITAL_COMMUNITY): Payer: Self-pay | Admitting: Urology

## 2018-07-04 ENCOUNTER — Other Ambulatory Visit: Payer: Self-pay | Admitting: Urology

## 2018-07-12 ENCOUNTER — Other Ambulatory Visit: Payer: Self-pay

## 2018-07-12 ENCOUNTER — Encounter (HOSPITAL_COMMUNITY): Payer: Self-pay | Admitting: *Deleted

## 2018-07-12 MED ORDER — GENTAMICIN SULFATE 40 MG/ML IJ SOLN
240.0000 mg | INTRAVENOUS | Status: AC
  Administered 2018-07-13: 240 mg via INTRAVENOUS
  Filled 2018-07-12 (×2): qty 6

## 2018-07-12 NOTE — Progress Notes (Signed)
At time of initial preop call on 07/11/2018 sister, Kendal HymenBonnie reports patient has a dry , hacky cough which she has called and made Dr Marlou PorchHerrick office aware.  Per office Lossie Faes( Pam Gibson) Dr Marlou PorchHerrick is aware and would like to proceed with surgery to remove stent.   Called and spoke with sister , Kendal HymenBonnie on 07/12/2018 and she is aware.  Kendal HymenBonnie reports that patient has not had a fever.

## 2018-07-13 ENCOUNTER — Ambulatory Visit (HOSPITAL_COMMUNITY): Payer: Self-pay | Admitting: Anesthesiology

## 2018-07-13 ENCOUNTER — Ambulatory Visit (HOSPITAL_COMMUNITY): Payer: Self-pay

## 2018-07-13 ENCOUNTER — Encounter (HOSPITAL_COMMUNITY): Payer: Self-pay | Admitting: Emergency Medicine

## 2018-07-13 ENCOUNTER — Ambulatory Visit (HOSPITAL_COMMUNITY)
Admission: RE | Admit: 2018-07-13 | Discharge: 2018-07-13 | Disposition: A | Discharge status: Home / Self Care | Payer: Self-pay | Attending: Urology | Admitting: Urology

## 2018-07-13 ENCOUNTER — Encounter (HOSPITAL_COMMUNITY)
Admission: RE | Disposition: A | Discharge status: Home / Self Care | Payer: Self-pay | Source: Home / Self Care | Attending: Urology

## 2018-07-13 ENCOUNTER — Other Ambulatory Visit: Payer: Self-pay

## 2018-07-13 DIAGNOSIS — N200 Calculus of kidney: Secondary | ICD-10-CM | POA: Insufficient documentation

## 2018-07-13 DIAGNOSIS — F419 Anxiety disorder, unspecified: Secondary | ICD-10-CM | POA: Insufficient documentation

## 2018-07-13 DIAGNOSIS — I712 Thoracic aortic aneurysm, without rupture: Secondary | ICD-10-CM | POA: Insufficient documentation

## 2018-07-13 DIAGNOSIS — Z8042 Family history of malignant neoplasm of prostate: Secondary | ICD-10-CM | POA: Insufficient documentation

## 2018-07-13 DIAGNOSIS — F17200 Nicotine dependence, unspecified, uncomplicated: Secondary | ICD-10-CM | POA: Insufficient documentation

## 2018-07-13 DIAGNOSIS — Z820 Family history of epilepsy and other diseases of the nervous system: Secondary | ICD-10-CM | POA: Insufficient documentation

## 2018-07-13 DIAGNOSIS — F329 Major depressive disorder, single episode, unspecified: Secondary | ICD-10-CM | POA: Insufficient documentation

## 2018-07-13 DIAGNOSIS — R05 Cough: Secondary | ICD-10-CM | POA: Insufficient documentation

## 2018-07-13 HISTORY — DX: Cough: R05

## 2018-07-13 HISTORY — PX: CYSTOSCOPY/URETEROSCOPY/HOLMIUM LASER/STENT PLACEMENT: SHX6546

## 2018-07-13 HISTORY — DX: Family history of other specified conditions: Z84.89

## 2018-07-13 LAB — CBC
HCT: 42.1 % (ref 36.0–46.0)
Hemoglobin: 12.9 g/dL (ref 12.0–15.0)
MCH: 27.8 pg (ref 26.0–34.0)
MCHC: 30.6 g/dL (ref 30.0–36.0)
MCV: 90.7 fL (ref 80.0–100.0)
Platelets: 380 10*3/uL (ref 150–400)
RBC: 4.64 MIL/uL (ref 3.87–5.11)
RDW: 13.2 % (ref 11.5–15.5)
WBC: 6.2 10*3/uL (ref 4.0–10.5)
nRBC: 0 % (ref 0.0–0.2)

## 2018-07-13 LAB — BASIC METABOLIC PANEL
Anion gap: 10 (ref 5–15)
BUN: 14 mg/dL (ref 8–23)
CO2: 25 mmol/L (ref 22–32)
Calcium: 9.5 mg/dL (ref 8.9–10.3)
Chloride: 104 mmol/L (ref 98–111)
Creatinine, Ser: 0.89 mg/dL (ref 0.44–1.00)
GFR calc Af Amer: >60 mL/min (ref >60)
GFR calc non Af Amer: >60 mL/min (ref >60)
Glucose, Bld: 89 mg/dL (ref 70–99)
Potassium: 4.4 mmol/L (ref 3.5–5.1)
Sodium: 139 mmol/L (ref 135–145)

## 2018-07-13 SURGERY — CYSTOSCOPY/URETEROSCOPY/HOLMIUM LASER/STENT PLACEMENT
Anesthesia: General | Laterality: Left

## 2018-07-13 MED ORDER — BELLADONNA ALKALOIDS-OPIUM 16.2-60 MG RE SUPP
RECTAL | Status: DC | PRN
  Administered 2018-07-13: 1 via RECTAL

## 2018-07-13 MED ORDER — ONDANSETRON HCL 4 MG/2ML IJ SOLN: 4.0000 mg | Freq: Once | INTRAMUSCULAR | Status: DC | PRN

## 2018-07-13 MED ORDER — BELLADONNA ALKALOIDS-OPIUM 16.2-30 MG RE SUPP
RECTAL | Status: AC
  Filled 2018-07-13: qty 1

## 2018-07-13 MED ORDER — ONDANSETRON HCL 4 MG/2ML IJ SOLN
INTRAMUSCULAR | Status: AC
  Filled 2018-07-13: qty 2

## 2018-07-13 MED ORDER — LIDOCAINE 2% (20 MG/ML) 5 ML SYRINGE
INTRAMUSCULAR | Status: AC
  Filled 2018-07-13: qty 5

## 2018-07-13 MED ORDER — DEXAMETHASONE SODIUM PHOSPHATE 10 MG/ML IJ SOLN
INTRAMUSCULAR | Status: AC
  Filled 2018-07-13: qty 1

## 2018-07-13 MED ORDER — PHENYLEPHRINE 40 MCG/ML (10ML) SYRINGE FOR IV PUSH (FOR BLOOD PRESSURE SUPPORT)
PREFILLED_SYRINGE | INTRAVENOUS | Status: DC | PRN
  Administered 2018-07-13: 80 ug via INTRAVENOUS
  Administered 2018-07-13: 120 ug via INTRAVENOUS

## 2018-07-13 MED ORDER — FENTANYL CITRATE (PF) 100 MCG/2ML IJ SOLN: 25.0000 ug | INTRAMUSCULAR | Status: DC | PRN

## 2018-07-13 MED ORDER — PROPOFOL 10 MG/ML IV BOLUS
INTRAVENOUS | Status: AC
  Filled 2018-07-13: qty 20

## 2018-07-13 MED ORDER — DEXAMETHASONE SODIUM PHOSPHATE 10 MG/ML IJ SOLN
INTRAMUSCULAR | Status: DC | PRN
  Administered 2018-07-13: 4 mg via INTRAVENOUS

## 2018-07-13 MED ORDER — 0.9 % SODIUM CHLORIDE (POUR BTL) OPTIME
TOPICAL | Status: DC | PRN
  Administered 2018-07-13: 1000 mL

## 2018-07-13 MED ORDER — FENTANYL CITRATE (PF) 100 MCG/2ML IJ SOLN
INTRAMUSCULAR | Status: DC | PRN
  Administered 2018-07-13: 25 ug via INTRAVENOUS
  Administered 2018-07-13: 50 ug via INTRAVENOUS

## 2018-07-13 MED ORDER — EPHEDRINE 5 MG/ML INJ
INTRAVENOUS | Status: AC
  Filled 2018-07-13: qty 10

## 2018-07-13 MED ORDER — LIDOCAINE 2% (20 MG/ML) 5 ML SYRINGE
INTRAMUSCULAR | Status: DC | PRN
  Administered 2018-07-13: 50 mg via INTRAVENOUS

## 2018-07-13 MED ORDER — PROPOFOL 10 MG/ML IV BOLUS
INTRAVENOUS | Status: DC | PRN
  Administered 2018-07-13: 100 mg via INTRAVENOUS

## 2018-07-13 MED ORDER — LACTATED RINGERS IV SOLN
INTRAVENOUS | Status: DC
  Administered 2018-07-13: 11:00:00 via INTRAVENOUS

## 2018-07-13 MED ORDER — ONDANSETRON HCL 4 MG/2ML IJ SOLN
INTRAMUSCULAR | Status: DC | PRN
  Administered 2018-07-13: 4 mg via INTRAVENOUS

## 2018-07-13 MED ORDER — PHENAZOPYRIDINE HCL 200 MG PO TABS: 200.0000 mg | ORAL_TABLET | Freq: Three times a day (TID) | ORAL | 0 refills | Status: DC | PRN

## 2018-07-13 MED ORDER — SODIUM CHLORIDE 0.9 % IR SOLN
Status: DC | PRN
  Administered 2018-07-13: 3000 mL

## 2018-07-13 MED ORDER — FENTANYL CITRATE (PF) 100 MCG/2ML IJ SOLN
INTRAMUSCULAR | Status: AC
  Filled 2018-07-13: qty 2

## 2018-07-13 MED ORDER — EPHEDRINE SULFATE-NACL 50-0.9 MG/10ML-% IV SOSY
PREFILLED_SYRINGE | INTRAVENOUS | Status: DC | PRN
  Administered 2018-07-13 (×3): 5 mg via INTRAVENOUS

## 2018-07-13 SURGICAL SUPPLY — 20 items
BAG URO CATCHER STRL LF (MISCELLANEOUS) ×3 IMPLANT
BASKET ZERO TIP NITINOL 2.4FR (BASKET) ×3 IMPLANT
CATH URET 5FR 28IN OPEN ENDED (CATHETERS) ×3 IMPLANT
CLOTH BEACON ORANGE TIMEOUT ST (SAFETY) ×3 IMPLANT
COVER WAND RF STERILE (DRAPES) IMPLANT
EXTRACTOR STONE 1.7FRX115CM (UROLOGICAL SUPPLIES) IMPLANT
FIBER LASER TRAC TIP (UROLOGICAL SUPPLIES) ×3 IMPLANT
GLOVE BIOGEL M STRL SZ7.5 (GLOVE) ×3 IMPLANT
GOWN STRL REUS W/TWL XL LVL3 (GOWN DISPOSABLE) ×3 IMPLANT
GUIDEWIRE STR DUAL SENSOR (WIRE) ×3 IMPLANT
GUIDEWIRE ZIPWRE .038 STRAIGHT (WIRE) ×3 IMPLANT
MANIFOLD NEPTUNE II (INSTRUMENTS) ×3 IMPLANT
PACK CYSTO (CUSTOM PROCEDURE TRAY) ×3 IMPLANT
SHEATH URETERAL 12FRX28CM (UROLOGICAL SUPPLIES) IMPLANT
SHEATH URETERAL 12FRX35CM (MISCELLANEOUS) IMPLANT
STENT URET 6FRX24 CONTOUR (STENTS) ×3 IMPLANT
TUBING CONNECTING 10 (TUBING) ×2 IMPLANT
TUBING CONNECTING 10' (TUBING) ×1
TUBING UROLOGY SET (TUBING) ×3 IMPLANT
WIRE COONS/BENSON .038X145CM (WIRE) IMPLANT

## 2018-07-13 NOTE — Op Note (Signed)
Preoperative diagnosis:  1. Encrusted/retained left ureteral stent 2. Left renal pelvic stone  Postoperative diagnosis:  1. Same  Procedure: 1. Cystoscopy, left retrograde pyelogram with interpretation 2. Left ureteroscopy, laser lithotripsy and stent extraction 3. Left ureteral stent exchange  Surgeon: Christina Fat, MD  Anesthesia: General  Complications: None  Intraoperative findings:  #1: 10 cc of Omnipaque contrast was instilled into the patient's left distal ureter through a 5 [place] open-ended catheter demonstrating no significant hydroureteronephrosis of the ureter.  The left kidney was dilated and the calyces blunted.  There was a large stone noted in the renal pelvis circumferentially around the encrusted stent. #2: Ureteroscopy demonstrated no significant stones or abnormalities within the ureter.  Stent was noted to be heavily encrusted along the curl of the proximal aspect.  I was able to laser this and with the basket remove the stent. #3: The majority of the stone was lasered into small size dust pieces. #4: A 24 cm x 7 French double-J ureteral stent was exchanged in the patient's left ureter  EBL: Minimal  Specimens: None  Indication: Christina Everett is a 62 y.o. patient with history of septic stone and had a stent placed in April.  She subsequently had difficulty following up and ultimately at the time of the planned ureteroscopy and stone removal, 8 months later, she was noted to have a very large bladder stone in encapsulating the curl of the stent within the bladder.  This was treated at her previous surgery.  She presents today for stent extraction.  After reviewing the management options for treatment, he elected to proceed with the above surgical procedure(s). We have discussed the potential benefits and risks of the procedure, side effects of the proposed treatment, the likelihood of the patient achieving the goals of the procedure, and any potential problems  that might occur during the procedure or recuperation. Informed consent has been obtained.  Description of procedure:  The patient was taken to the operating room and general anesthesia was induced.  The patient was placed in the dorsal lithotomy position, prepped and draped in the usual sterile fashion, and preoperative antibiotics were administered. A preoperative time-out was performed.   21 French 30 degrees cystoscope was gently passed to the patient's urethra into the bladder under visual guidance.  Cystoscopy demonstrated no residual stone fragments and normal mucosa.  There was a stent emanating from the patient's left ureter which I grasped with the distal aspect and pulled to the urethral meatus I then advanced a 0.038 sensor wire through the stent and into the left renal pelvis removing the stent over the wire.  I then performed rigid ureteroscopy noting that the encrusted retained stent had migrated up into the proximal ureter.  There was an encrusted stent with a large stone burden in the renal pelvis.  I then advanced a Glidewire through the rigid scope and backed the scope out over the wire.  I then advanced the flexible ureteroscope over the wire and into the renal pelvis.  I then used the 200 m laser fiber to fragment the stones that were encapsulating the stent.  I was able to get it free and then with the basket the stent was removed.  I then reintroduced the flexible ureteroscope and lasered the encrusted fragments.  Once all the large pieces had been fragmented into small enough pieces I slowly backed out the scope inspecting the ureter all the way out noting no significant ureteral trauma or issues.  I then advanced  a 7 [place]French x24 cm double-J ureteral stent over the wire under fluoroscopic guidance advanced it up in the left renal pelvis.  Once the stent was noted to be well within the renal pelvis I advanced the stent to the urethral meatus and slowly backed out the wire under  fluoroscopic guidance.  A nice curl was noted in the renal pelvis as well as in the bladder.  I then emptied the bladder.  Placed a B&O suppository into the patient's rectum.  The patient subsequently extubated return the PACU in stable condition. Christina FatBenjamin W. Zhara Everett, M.D.

## 2018-07-13 NOTE — Progress Notes (Addendum)
Dr. Bradley FerrisEllender notified that pt took one sip of her sisters coffee this morning at 0530 that had cream in it.  Pt denies eating today.

## 2018-07-13 NOTE — Transfer of Care (Signed)
Immediate Anesthesia Transfer of Care Note  Patient: Christina BarthelShirley Everett  Procedure(s) Performed: LEFT  URETEROSCOPY/HOLMIUM LASER/STONE EXTRACTION /STENT EXTRACTION AND EXCHANGE (Left )  Patient Location: PACU  Anesthesia Type:General  Level of Consciousness: awake, alert  and oriented  Airway & Oxygen Therapy: Patient Spontanous Breathing and Patient connected to face mask oxygen  Post-op Assessment: Report given to RN and Post -op Vital signs reviewed and stable  Post vital signs: Reviewed and stable  Last Vitals:  Vitals Value Taken Time  BP 119/63 07/13/2018  1:40 PM  Temp 36.9 C 07/13/2018  1:40 PM  Pulse 70 07/13/2018  1:42 PM  Resp 15 07/13/2018  1:42 PM  SpO2 100 % 07/13/2018  1:42 PM  Vitals shown include unvalidated device data.  Last Pain:  Vitals:   07/13/18 1042  TempSrc:   PainSc: 0-No pain      Patients Stated Pain Goal: 4 (07/13/18 1042)  Complications: No apparent anesthesia complications

## 2018-07-13 NOTE — Discharge Instructions (Signed)
DISCHARGE INSTRUCTIONS FOR KIDNEY STONE/URETERAL STENT   MEDICATIONS:  1.  Resume all your other meds from home.  2. Pyridium is to help with the burning/stinging when you urinate.   ACTIVITY:  1. No strenuous activity x 1week  2. No driving while on narcotic pain medications  3. Drink plenty of water  4. Continue to walk at home - you can still get blood clots when you are at home, so keep active, but don't over do it.  5. May return to work/school tomorrow or when you feel ready   BATHING:  1. You can shower and we recommend daily showers   SIGNS/SYMPTOMS TO CALL:  Please call us if you have a fever greater than 101.5, uncontrolled nausea/vomiting, uncontrolled pain, dizziness, unable to urinate, bloody urine, chest pain, shortness of breath, leg swelling, leg pain, redness around wound, drainage from wound, or any other concerns or questions.   You can reach us at [phone].   FOLLOW-UP:  1. You will be scheduled for f/u surgery in 2 weeks.  Someone from Dr. Jasmine AweHerrick's office will contact you with more information.

## 2018-07-13 NOTE — Anesthesia Procedure Notes (Signed)
Procedure Name: LMA Insertion Date/Time: 07/13/2018 12:32 PM Performed by: Nelle DonPulliam, Monroe Qin C, CRNA Pre-anesthesia Checklist: Patient identified, Emergency Drugs available, Suction available and Patient being monitored Patient Re-evaluated:Patient Re-evaluated prior to induction Oxygen Delivery Method: Circle system utilized Preoxygenation: Pre-oxygenation with 100% oxygen Induction Type: IV induction Ventilation: Mask ventilation without difficulty LMA: LMA with gastric port inserted LMA Size: 3.0 Dental Injury: Teeth and Oropharynx as per pre-operative assessment

## 2018-07-13 NOTE — Anesthesia Postprocedure Evaluation (Signed)
Anesthesia Post Note  Patient: Christina BarthelShirley Everett  Procedure(s) Performed: LEFT  URETEROSCOPY/HOLMIUM LASER/STONE EXTRACTION /STENT EXTRACTION AND EXCHANGE (Left )     Patient location during evaluation: PACU Anesthesia Type: General Level of consciousness: awake Pain management: pain level controlled Vital Signs Assessment: post-procedure vital signs reviewed and stable Respiratory status: spontaneous breathing, nonlabored ventilation, respiratory function stable and patient connected to nasal cannula oxygen Cardiovascular status: blood pressure returned to baseline and stable Postop Assessment: no apparent nausea or vomiting Anesthetic complications: no    Last Vitals:  Vitals:   07/13/18 1415 07/13/18 1502  BP: 112/62 110/70  Pulse: 67 65  Resp: 15 15  Temp:  36.7 C  SpO2: 99% 100%    Last Pain:  Vitals:   07/13/18 1415  TempSrc:   PainSc: 0-No pain                 Nain Rudd P Dhruv Christina

## 2018-07-13 NOTE — Interval H&P Note (Signed)
History and Physical Interval Note: The patient was noted to have a large bladder stone encasing the distal aspect of her stent.  This was taken care of in the last procedure, but the stent was encrusted within the kidney as well and I was unable to remove it.  I did place a second stent and she presents today for follow-up so that the initial stent can be lasered free and removed.  This is again part of a staged procedure that will require a third procedure to remove the stones.  I discussed this case with the patient as well as her sister who is her D POA.  07/13/2018 12:09 PM  Christina BarthelShirley Everett  has presented today for surgery, with the diagnosis of LEFT URETEROPELVIC JUNCTION STONE, RETAINED STENT  The various methods of treatment have been discussed with the patient and family. After consideration of risks, benefits and other options for treatment, the patient has consented to  Procedure(s): LEFT  URETEROSCOPY/HOLMIUM LASER/STENT EXCHANGE (Left) as a surgical intervention .  The patient's history has been reviewed, patient examined, no change in status, stable for surgery.  I have reviewed the patient's chart and labs.  Questions were answered to the patient's satisfaction.     Crist FatBenjamin W Caige Almeda

## 2018-07-13 NOTE — Anesthesia Preprocedure Evaluation (Addendum)
Anesthesia Evaluation  Patient identified by MRN, date of birth, ID band Patient awake    Reviewed: Allergy & Precautions, NPO status , Patient's Chart, lab work & pertinent test results  Airway Mallampati: II  TM Distance: >3 FB Neck ROM: Full    Dental  (+) Edentulous Upper, Edentulous Lower   Pulmonary Current Smoker,  Cough   Pulmonary exam normal breath sounds clear to auscultation       Cardiovascular negative cardio ROS Normal cardiovascular exam Rhythm:Regular Rate:Normal  Thoracic ascending aortic aneurysm   ECG: ST, rate 119   Neuro/Psych PSYCHIATRIC DISORDERS Mentally disabled    GI/Hepatic negative GI ROS, Neg liver ROS,   Endo/Other  negative endocrine ROS  Renal/GU negative Renal ROS     Musculoskeletal negative musculoskeletal ROS (+)   Abdominal   Peds  Hematology negative hematology ROS (+)   Anesthesia Other Findings LEFT URETEROPELVIC JUNCTION STONE, RETAINED STENT  Reproductive/Obstetrics                            Anesthesia Physical Anesthesia Plan  ASA: III  Anesthesia Plan: General   Post-op Pain Management:    Induction: Intravenous  PONV Risk Score and Plan: 3 and Dexamethasone, Ondansetron and Treatment may vary due to age or medical condition  Airway Management Planned: LMA  Additional Equipment:   Intra-op Plan:   Post-operative Plan: Extubation in OR  Informed Consent: I have reviewed the patients History and Physical, chart, labs and discussed the procedure including the risks, benefits and alternatives for the proposed anesthesia with the patient or authorized representative who has indicated his/her understanding and acceptance.     Plan Discussed with: CRNA  Anesthesia Plan Comments:        Anesthesia Quick Evaluation

## 2018-07-14 ENCOUNTER — Encounter (HOSPITAL_COMMUNITY): Payer: Self-pay | Admitting: Urology

## 2018-07-15 ENCOUNTER — Other Ambulatory Visit: Payer: Self-pay | Admitting: Urology

## 2018-08-04 NOTE — Patient Instructions (Addendum)
Christina Everett  08/04/2018   Your procedure is scheduled on: 08-11-18   Report to [facility] Main  Entrance    Report to Admitting at 9:00 AM    Call this number if you have problems the morning of surgery [phone]    Remember: Do not eat food or drink liquids :After Midnight.    BRUSH YOUR TEETH MORNING OF SURGERY AND RINSE YOUR MOUTH OUT, NO CHEWING GUM CANDY OR MINTS.     Take these medicines the morning of surgery with A SIP OF WATER: None                                You may not have any metal on your body including hair pins and              piercings  Do not wear jewelry, make-up, lotions, powders or perfumes, deodorant             Do not wear nail polish.  Do not shave  48 hours prior to surgery.               Do not bring valuables to the hospital. [place] [facility] IS NOT             RESPONSIBLE   FOR VALUABLES.  Contacts, dentures or bridgework may not be worn into surgery.      Patients discharged the day of surgery will not be allowed to drive home. IF YOU ARE HAVING SURGERY AND GOING HOME THE SAME DAY, YOU MUST HAVE AN ADULT TO DRIVE YOU HOME AND BE WITH YOU FOR 24 HOURS. YOU MAY GO HOME BY TAXI OR UBER OR ORTHERWISE, BUT AN ADULT MUST ACCOMPANY YOU HOME AND STAY WITH YOU FOR 24 HOURS.     Name and phone number of your driver: Darrol Jump [phone]  Special Instructions: N/A              Please read over the following fact sheets you were given: _____________________________________________________________________             [facility] - Preparing for Surgery Before surgery, you can play an important role.  Because skin is not sterile, your skin needs to be as free of germs as possible.  You can reduce the number of germs on your skin by washing with CHG (chlorahexidine gluconate) soap before surgery.  CHG is an antiseptic cleaner which kills germs and bonds with the skin to continue killing germs even after washing. Please DO NOT  use if you have an allergy to CHG or antibacterial soaps.  If your skin becomes reddened/irritated stop using the CHG and inform your nurse when you arrive at Short Stay. Do not shave (including legs and underarms) for at least 48 hours prior to the first CHG shower.  You may shave your face/neck. Please follow these instructions carefully:  1.  Shower with CHG Soap the night before surgery and the  morning of Surgery.  2.  If you choose to wash your hair, wash your hair first as usual with your  normal  shampoo.  3.  After you shampoo, rinse your hair and body thoroughly to remove the  shampoo.  4.  Use CHG as you would any other liquid soap.  You can apply chg directly  to the skin and wash                       Gently with a scrungie or clean washcloth.  5.  Apply the CHG Soap to your body ONLY FROM THE NECK DOWN.   Do not use on face/ open                           Wound or open sores. Avoid contact with eyes, ears mouth and genitals (private parts).                       Wash face,  Genitals (private parts) with your normal soap.             6.  Wash thoroughly, paying special attention to the area where your surgery  will be performed.  7.  Thoroughly rinse your body with warm water from the neck down.  8.  DO NOT shower/wash with your normal soap after using and rinsing off  the CHG Soap.                9.  Pat yourself dry with a clean towel.            10.  Wear clean pajamas.            11.  Place clean sheets on your bed the night of your first shower and do not  sleep with pets. Day of Surgery : Do not apply any lotions/deodorants the morning of surgery.  Please wear clean clothes to the hospital/surgery center.  FAILURE TO FOLLOW THESE INSTRUCTIONS MAY RESULT IN THE CANCELLATION OF YOUR SURGERY PATIENT SIGNATURE_________________________________  NURSE  SIGNATURE__________________________________  ________________________________________________________________________

## 2018-08-04 NOTE — Progress Notes (Signed)
10-31-17 ( Epic) EKG, and CXR

## 2018-08-05 ENCOUNTER — Encounter (INDEPENDENT_AMBULATORY_CARE_PROVIDER_SITE_OTHER): Payer: Self-pay

## 2018-08-05 ENCOUNTER — Encounter (HOSPITAL_COMMUNITY): Payer: Self-pay

## 2018-08-05 ENCOUNTER — Other Ambulatory Visit: Payer: Self-pay

## 2018-08-05 ENCOUNTER — Encounter (HOSPITAL_COMMUNITY)
Admission: RE | Admit: 2018-08-05 | Discharge: 2018-08-05 | Disposition: A | Discharge status: Home / Self Care | Payer: Self-pay | Source: Ambulatory Visit | Attending: Urology | Admitting: Urology

## 2018-08-05 DIAGNOSIS — N211 Calculus in urethra: Secondary | ICD-10-CM | POA: Insufficient documentation

## 2018-08-05 DIAGNOSIS — Z01812 Encounter for preprocedural laboratory examination: Secondary | ICD-10-CM | POA: Insufficient documentation

## 2018-08-05 LAB — CBC
HEMATOCRIT: 41.7 % (ref 36.0–46.0)
Hemoglobin: 12.7 g/dL (ref 12.0–15.0)
MCH: 27.7 pg (ref 26.0–34.0)
MCHC: 30.5 g/dL (ref 30.0–36.0)
MCV: 90.8 fL (ref 80.0–100.0)
Platelets: 430 10*3/uL — ABNORMAL HIGH (ref 150–400)
RBC: 4.59 MIL/uL (ref 3.87–5.11)
RDW: 13.5 % (ref 11.5–15.5)
WBC: 6.7 10*3/uL (ref 4.0–10.5)
nRBC: 0 % (ref 0.0–0.2)

## 2018-08-05 LAB — BASIC METABOLIC PANEL
Anion gap: 9 (ref 5–15)
BUN: 17 mg/dL (ref 8–23)
CO2: 28 mmol/L (ref 22–32)
Calcium: 9.3 mg/dL (ref 8.9–10.3)
Chloride: 104 mmol/L (ref 98–111)
Creatinine, Ser: 0.88 mg/dL (ref 0.44–1.00)
GFR calc Af Amer: >60 mL/min (ref >60)
GFR calc non Af Amer: >60 mL/min (ref >60)
Glucose, Bld: 138 mg/dL — ABNORMAL HIGH (ref 70–99)
Potassium: 3.7 mmol/L (ref 3.5–5.1)
Sodium: 141 mmol/L (ref 135–145)

## 2018-08-05 NOTE — Anesthesia Preprocedure Evaluation (Addendum)
Anesthesia Evaluation  Patient identified by MRN, date of birth, ID band Patient awake    Reviewed: Allergy & Precautions, NPO status , Patient's Chart, lab work & pertinent test results  Airway Mallampati: II  TM Distance: >3 FB Neck ROM: Full    Dental  (+) Dental Advisory Given   Pulmonary neg pulmonary ROS, Current Smoker,    Pulmonary exam normal breath sounds clear to auscultation       Cardiovascular negative cardio ROS Normal cardiovascular exam Rhythm:Regular Rate:Normal     Neuro/Psych negative neurological ROS  negative psych ROS   GI/Hepatic negative GI ROS, Neg liver ROS,   Endo/Other  negative endocrine ROS  Renal/GU Renal disease     Musculoskeletal negative musculoskeletal ROS (+)   Abdominal   Peds  Hematology negative hematology ROS (+)   Anesthesia Other Findings   Reproductive/Obstetrics negative OB ROS                                                             Anesthesia Evaluation  Patient identified by MRN, date of birth, ID band Patient awake    Reviewed: Allergy & Precautions, NPO status , Patient's Chart, lab work & pertinent test results  Airway Mallampati: II  TM Distance: >3 FB Neck ROM: Full    Dental  (+) Edentulous Upper, Edentulous Lower   Pulmonary Current Smoker,  Cough   Pulmonary exam normal breath sounds clear to auscultation       Cardiovascular negative cardio ROS Normal cardiovascular exam Rhythm:Regular Rate:Normal  Thoracic ascending aortic aneurysm   ECG: ST, rate 119   Neuro/Psych PSYCHIATRIC DISORDERS Mentally disabled    GI/Hepatic negative GI ROS, Neg liver ROS,   Endo/Other  negative endocrine ROS  Renal/GU negative Renal ROS     Musculoskeletal negative musculoskeletal ROS (+)   Abdominal   Peds  Hematology negative hematology ROS (+)   Anesthesia Other Findings LEFT URETEROPELVIC JUNCTION  STONE, RETAINED STENT  Reproductive/Obstetrics                            Anesthesia Physical Anesthesia Plan  ASA: III  Anesthesia Plan: General   Post-op Pain Management:    Induction: Intravenous  PONV Risk Score and Plan: 3 and Dexamethasone, Ondansetron and Treatment may vary due to age or medical condition  Airway Management Planned: LMA  Additional Equipment:   Intra-op Plan:   Post-operative Plan: Extubation in OR  Informed Consent: I have reviewed the patients History and Physical, chart, labs and discussed the procedure including the risks, benefits and alternatives for the proposed anesthesia with the patient or authorized representative who has indicated his/her understanding and acceptance.     Plan Discussed with: CRNA  Anesthesia Plan Comments:        Anesthesia Quick Evaluation  Anesthesia Physical Anesthesia Plan  ASA: III  Anesthesia Plan: General   Post-op Pain Management:    Induction: Intravenous  PONV Risk Score and Plan:   Airway Management Planned: LMA  Additional Equipment:   Intra-op Plan:   Post-operative Plan: Extubation in OR  Informed Consent: I have reviewed the patients History and Physical, chart, labs and discussed the procedure including the risks, benefits and alternatives for the proposed anesthesia with the patient or  authorized representative who has indicated his/her understanding and acceptance.     Dental advisory given  Plan Discussed with: CRNA  Anesthesia Plan Comments: (Ascending thoracic aortic aneurysm noted on CT 10/2017 measuring 4.493mm, annual follow up CTA or MRI recommended at that time. Jodell CiproJessica Zanetto, PA-C)       Anesthesia Quick Evaluation

## 2018-08-10 MED ORDER — GENTAMICIN SULFATE 40 MG/ML IJ SOLN
5.0000 mg/kg | INTRAVENOUS | Status: AC
  Administered 2018-08-19: 250 mg via INTRAVENOUS
  Filled 2018-08-10 (×2): qty 6.25

## 2018-08-16 NOTE — Progress Notes (Signed)
Spoke with Lajuana Ripple, sister of patient and informed her of time patient needs to report to Admitting at [facility] and nothing to eat or drink after midnight and no medications to take. She verbalized understanding.

## 2018-08-19 ENCOUNTER — Encounter (HOSPITAL_COMMUNITY)
Admission: RE | Disposition: A | Discharge status: Home / Self Care | Payer: Self-pay | Source: Home / Self Care | Attending: Urology

## 2018-08-19 ENCOUNTER — Ambulatory Visit (HOSPITAL_COMMUNITY): Payer: Self-pay | Admitting: Physician Assistant

## 2018-08-19 ENCOUNTER — Ambulatory Visit (HOSPITAL_COMMUNITY)
Admission: RE | Admit: 2018-08-19 | Discharge: 2018-08-19 | Disposition: A | Discharge status: Home / Self Care | Payer: Self-pay | Attending: Urology | Admitting: Urology

## 2018-08-19 ENCOUNTER — Ambulatory Visit (HOSPITAL_COMMUNITY): Payer: Self-pay

## 2018-08-19 ENCOUNTER — Encounter (HOSPITAL_COMMUNITY): Payer: Self-pay | Admitting: *Deleted

## 2018-08-19 ENCOUNTER — Ambulatory Visit (HOSPITAL_COMMUNITY): Payer: Self-pay | Admitting: Certified Registered"

## 2018-08-19 ENCOUNTER — Other Ambulatory Visit: Payer: Self-pay

## 2018-08-19 DIAGNOSIS — N3000 Acute cystitis without hematuria: Secondary | ICD-10-CM | POA: Insufficient documentation

## 2018-08-19 DIAGNOSIS — F17210 Nicotine dependence, cigarettes, uncomplicated: Secondary | ICD-10-CM | POA: Insufficient documentation

## 2018-08-19 DIAGNOSIS — N202 Calculus of kidney with calculus of ureter: Secondary | ICD-10-CM | POA: Insufficient documentation

## 2018-08-19 DIAGNOSIS — Z87442 Personal history of urinary calculi: Secondary | ICD-10-CM | POA: Insufficient documentation

## 2018-08-19 DIAGNOSIS — A419 Sepsis, unspecified organism: Secondary | ICD-10-CM

## 2018-08-19 DIAGNOSIS — R6521 Severe sepsis with septic shock: Secondary | ICD-10-CM

## 2018-08-19 HISTORY — PX: CYSTOSCOPY/URETEROSCOPY/HOLMIUM LASER/STENT PLACEMENT: SHX6546

## 2018-08-19 SURGERY — CYSTOSCOPY/URETEROSCOPY/HOLMIUM LASER/STENT PLACEMENT
Anesthesia: General | Site: Ureter | Laterality: Left

## 2018-08-19 MED ORDER — LACTATED RINGERS IV SOLN
INTRAVENOUS | Status: DC
  Administered 2018-08-19: 08:00:00 via INTRAVENOUS

## 2018-08-19 MED ORDER — ONDANSETRON HCL 4 MG/2ML IJ SOLN
INTRAMUSCULAR | Status: DC | PRN
  Administered 2018-08-19: 4 mg via INTRAVENOUS

## 2018-08-19 MED ORDER — BELLADONNA ALKALOIDS-OPIUM 16.2-60 MG RE SUPP
RECTAL | Status: DC | PRN
  Administered 2018-08-19: 1 via RECTAL

## 2018-08-19 MED ORDER — DEXAMETHASONE SODIUM PHOSPHATE 10 MG/ML IJ SOLN
INTRAMUSCULAR | Status: AC
  Filled 2018-08-19: qty 1

## 2018-08-19 MED ORDER — FENTANYL CITRATE (PF) 100 MCG/2ML IJ SOLN
INTRAMUSCULAR | Status: AC
  Filled 2018-08-19: qty 2

## 2018-08-19 MED ORDER — PROPOFOL 10 MG/ML IV BOLUS
INTRAVENOUS | Status: AC
  Filled 2018-08-19: qty 20

## 2018-08-19 MED ORDER — EPHEDRINE 5 MG/ML INJ
INTRAVENOUS | Status: AC
  Filled 2018-08-19: qty 10

## 2018-08-19 MED ORDER — LIDOCAINE 2% (20 MG/ML) 5 ML SYRINGE
INTRAMUSCULAR | Status: AC
  Filled 2018-08-19: qty 5

## 2018-08-19 MED ORDER — DEXAMETHASONE SODIUM PHOSPHATE 10 MG/ML IJ SOLN
INTRAMUSCULAR | Status: DC | PRN
  Administered 2018-08-19: 10 mg via INTRAVENOUS

## 2018-08-19 MED ORDER — PROMETHAZINE HCL 25 MG/ML IJ SOLN: 6.2500 mg | INTRAMUSCULAR | Status: DC | PRN

## 2018-08-19 MED ORDER — PHENAZOPYRIDINE HCL 200 MG PO TABS: 200.0000 mg | ORAL_TABLET | Freq: Three times a day (TID) | ORAL | 0 refills | Status: DC | PRN

## 2018-08-19 MED ORDER — FENTANYL CITRATE (PF) 100 MCG/2ML IJ SOLN
INTRAMUSCULAR | Status: DC | PRN
  Administered 2018-08-19 (×4): 25 ug via INTRAVENOUS

## 2018-08-19 MED ORDER — SODIUM CHLORIDE 0.9 % IR SOLN
Status: DC | PRN
  Administered 2018-08-19: 3000 mL

## 2018-08-19 MED ORDER — FENTANYL CITRATE (PF) 100 MCG/2ML IJ SOLN: 25.0000 ug | INTRAMUSCULAR | Status: DC | PRN

## 2018-08-19 MED ORDER — EPHEDRINE SULFATE 50 MG/ML IJ SOLN
INTRAMUSCULAR | Status: DC | PRN
  Administered 2018-08-19 (×4): 5 mg via INTRAVENOUS

## 2018-08-19 MED ORDER — LIDOCAINE HCL (CARDIAC) PF 100 MG/5ML IV SOSY
PREFILLED_SYRINGE | INTRAVENOUS | Status: DC | PRN
  Administered 2018-08-19: 50 mg via INTRAVENOUS

## 2018-08-19 MED ORDER — PROPOFOL 10 MG/ML IV BOLUS
INTRAVENOUS | Status: DC | PRN
  Administered 2018-08-19: 100 mg via INTRAVENOUS

## 2018-08-19 MED ORDER — ONDANSETRON HCL 4 MG/2ML IJ SOLN
INTRAMUSCULAR | Status: AC
  Filled 2018-08-19: qty 2

## 2018-08-19 MED ORDER — TRAMADOL HCL 50 MG PO TABS: 50.0000 mg | ORAL_TABLET | Freq: Four times a day (QID) | ORAL | 0 refills | Status: DC | PRN

## 2018-08-19 MED ORDER — IOHEXOL 300 MG/ML  SOLN
INTRAMUSCULAR | Status: DC | PRN
  Administered 2018-08-19: 10 mL via URETHRAL

## 2018-08-19 MED ORDER — OXYCODONE HCL 5 MG/5ML PO SOLN
5.0000 mg | Freq: Once | ORAL | Status: DC | PRN
  Filled 2018-08-19: qty 5

## 2018-08-19 MED ORDER — OXYCODONE HCL 5 MG PO TABS: 5.0000 mg | ORAL_TABLET | Freq: Once | ORAL | Status: DC | PRN

## 2018-08-19 MED ORDER — 0.9 % SODIUM CHLORIDE (POUR BTL) OPTIME
TOPICAL | Status: DC | PRN
  Administered 2018-08-19: 1000 mL

## 2018-08-19 MED ORDER — BELLADONNA ALKALOIDS-OPIUM 16.2-30 MG RE SUPP
RECTAL | Status: AC
  Filled 2018-08-19: qty 1

## 2018-08-19 MED ORDER — MEPERIDINE HCL 50 MG/ML IJ SOLN: 6.2500 mg | INTRAMUSCULAR | Status: DC | PRN

## 2018-08-19 SURGICAL SUPPLY — 21 items
BAG URO CATCHER STRL LF (MISCELLANEOUS) ×3 IMPLANT
BASKET STONE 1.7 NGAGE (UROLOGICAL SUPPLIES) ×3 IMPLANT
BASKET STONE NCOMPASS (UROLOGICAL SUPPLIES) ×3 IMPLANT
BASKET ZERO TIP NITINOL 2.4FR (BASKET) IMPLANT
CATH URET 5FR 28IN OPEN ENDED (CATHETERS) ×3 IMPLANT
CLOTH BEACON ORANGE TIMEOUT ST (SAFETY) ×3 IMPLANT
COVER WAND RF STERILE (DRAPES) IMPLANT
EXTRACTOR STONE 1.7FRX115CM (UROLOGICAL SUPPLIES) IMPLANT
GLOVE BIOGEL M STRL SZ7.5 (GLOVE) ×3 IMPLANT
GOWN STRL REUS W/TWL XL LVL3 (GOWN DISPOSABLE) ×3 IMPLANT
GUIDEWIRE ANG ZIPWIRE 038X150 (WIRE) IMPLANT
GUIDEWIRE STR DUAL SENSOR (WIRE) ×3 IMPLANT
MANIFOLD NEPTUNE II (INSTRUMENTS) ×3 IMPLANT
PACK CYSTO (CUSTOM PROCEDURE TRAY) ×3 IMPLANT
SHEATH URETERAL 12FRX28CM (UROLOGICAL SUPPLIES) IMPLANT
SHEATH URETERAL 12FRX35CM (MISCELLANEOUS) ×3 IMPLANT
STENT URET 6FRX24 CONTOUR (STENTS) ×3 IMPLANT
TUBING CONNECTING 10 (TUBING) ×2 IMPLANT
TUBING CONNECTING 10' (TUBING) ×1
TUBING UROLOGY SET (TUBING) ×3 IMPLANT
WIRE COONS/BENSON .038X145CM (WIRE) ×3 IMPLANT

## 2018-08-19 NOTE — Discharge Instructions (Signed)
DISCHARGE INSTRUCTIONS FOR KIDNEY STONE/URETERAL STENT  ° °MEDICATIONS:  °1.  Resume all your other meds from home - except do not take any extra narcotic pain meds that you may have at home.  °2. Pyridium is to help with the burning/stinging when you urinate. °3. Tramadol is for moderate/severe pain, otherwise taking upto 1000 mg every 6 hours of plainTylenol will help treat your pain.   ° ° °ACTIVITY:  °1. No strenuous activity x 1week  °2. No driving while on narcotic pain medications  °3. Drink plenty of water  °4. Continue to walk at home - you can still get blood clots when you are at home, so keep active, but don't over do it.  °5. May return to work/school tomorrow or when you feel ready  ° °BATHING:  °1. You can shower and we recommend daily showers  ° ° °SIGNS/SYMPTOMS TO CALL:  °Please call us if you have a fever greater than 101.5, uncontrolled nausea/vomiting, uncontrolled pain, dizziness, unable to urinate, bloody urine, chest pain, shortness of breath, leg swelling, leg pain, redness around wound, drainage from wound, or any other concerns or questions.  ° °You can reach us at [phone].  ° °FOLLOW-UP:  °1. You have an appointment for stent removal in 1 week. ° °

## 2018-08-19 NOTE — Interval H&P Note (Signed)
History and Physical Interval Note: Patient presents for third stage ureteroscopy for removal of stone in her left collecting system.  No changes to her history and physical.  08/19/2018 8:48 AM  Barbaraann Barthel  has presented today for surgery, with the diagnosis of LEFT URETERAL STONE  The various methods of treatment have been discussed with the patient and family. After consideration of risks, benefits and other options for treatment, the patient has consented to  Procedure(s): LEFT URETEROSCOPY/HOLMIUM LASER/STENT EXCHANGE (Left) as a surgical intervention .  The patient's history has been reviewed, patient examined, no change in status, stable for surgery.  I have reviewed the patient's chart and labs.  Questions were answered to the patient's satisfaction.     Crist Fat

## 2018-08-19 NOTE — Anesthesia Postprocedure Evaluation (Signed)
Anesthesia Post Note  Patient: Christina Everett  Procedure(s) Performed: CYSTOSCOPY, RETROGRADE,LEFT URETEROSCOPY/LEFT URETERAL STENT EXCHANGE (Left Ureter)     Patient location during evaluation: PACU Anesthesia Type: General Level of consciousness: sedated and patient cooperative Pain management: pain level controlled Vital Signs Assessment: post-procedure vital signs reviewed and stable Respiratory status: spontaneous breathing Cardiovascular status: stable Anesthetic complications: no    Last Vitals:  Vitals:   08/19/18 1145 08/19/18 1155  BP: 120/73 121/73  Pulse: 82 84  Resp: 18 20  Temp: 36.4 C 36.4 C  SpO2: 99% 100%    Last Pain:  Vitals:   08/19/18 1155  TempSrc:   PainSc: 0-No pain                 Lewie Loron

## 2018-08-19 NOTE — Transfer of Care (Signed)
Immediate Anesthesia Transfer of Care Note  Patient: Christina Everett  Procedure(s) Performed: CYSTOSCOPY, RETROGRADE,LEFT URETEROSCOPY/LEFT URETERAL STENT EXCHANGE (Left Ureter)  Patient Location: PACU  Anesthesia Type:General  Level of Consciousness: awake, alert  and oriented  Airway & Oxygen Therapy: Patient Spontanous Breathing and Patient connected to face mask oxygen  Post-op Assessment: Report given to RN and Post -op Vital signs reviewed and stable  Post vital signs: Reviewed and stable  Last Vitals:  Vitals Value Taken Time  BP 116/62 08/19/2018 11:12 AM  Temp    Pulse 75 08/19/2018 11:13 AM  Resp 16 08/19/2018 11:13 AM  SpO2 100 % 08/19/2018 11:13 AM  Vitals shown include unvalidated device data.  Last Pain:  Vitals:   08/19/18 0756  TempSrc: Oral  PainSc: 0-No pain         Complications: No apparent anesthesia complications

## 2018-08-19 NOTE — Anesthesia Procedure Notes (Signed)
Procedure Name: LMA Insertion Date/Time: 08/19/2018 9:26 AM Performed by: Thornell Mule, CRNA Pre-anesthesia Checklist: Patient identified, Emergency Drugs available, Suction available and Patient being monitored Patient Re-evaluated:Patient Re-evaluated prior to induction Oxygen Delivery Method: Circle system utilized Preoxygenation: Pre-oxygenation with 100% oxygen Induction Type: IV induction LMA: LMA inserted LMA Size: 4.0 Number of attempts: 1 Placement Confirmation: positive ETCO2 Tube secured with: Tape Dental Injury: Teeth and Oropharynx as per pre-operative assessment

## 2018-08-19 NOTE — Op Note (Signed)
Preoperative diagnosis:  1. Left residual renal stone fragments  Postoperative diagnosis:  1. Same  Procedure: 1. Cystoscopy, left ureteral stent exchange 2. Left retrograde pyelogram with interpretation 3. Left ureteroscopy stone removal/basketing  Surgeon: Crist FatBenjamin W. Elfreda Blanchet, MD  Anesthesia: General  Complications: None  Intraoperative findings: 1.:  The patient had a retrograde pyelogram demonstrating a normal caliber ureter no filling defects.  There were numerous filling defects and incomplete filling of the left renal pelvis and lower pole consistent with the patient's known residual stone fragments. 2.:  The 24 cm x 6 French double-J ureteral stent was exchanged  EBL: Minimal  Specimens: Renal pelvic stones were removed and given to the patient  Indication: Christina Everett is a 63 y.o. patient with history of a left septic ureteral stone.  She subsequently was stented and then lost to follow-up.  When she did return she was noted to have an encrusted stent with a large bladder stone component as well as a proximal stone on the end of each side of the stent.  She presents today for third stage stone removal extraction and stent exchange.  After reviewing the management options for treatment, he elected to proceed with the above surgical procedure(s). We have discussed the potential benefits and risks of the procedure, side effects of the proposed treatment, the likelihood of the patient achieving the goals of the procedure, and any potential problems that might occur during the procedure or recuperation. Informed consent has been obtained.  Description of procedure:  The patient was taken to the operating room and general anesthesia was induced.  The patient was placed in the dorsal lithotomy position, prepped and draped in the usual sterile fashion, and preoperative antibiotics were administered. A preoperative time-out was performed.   21 French 30 degrees cystoscope was gently  passed through the patient's urethra and the bladder under visual guidance.  Stent was then grasped from the left ureteral orifice and pulled to the urethral meatus.  A 0.38 sensor wire was then advanced up through the wire and into the left renal pelvis removing stent over the wire.  I then exchange the wire for a 5 [place]French open-ended catheter and performed retrograde pyelogram with the above findings.  I then replaced the wire removing the catheter over the wire.  I then advanced a semirigid ureteroscope through the ureter noting no significant ureteral abnormalities.  There were some small stones in the proximal ureter that I was able to push into the renal pelvis.  I then advanced a second wire through the semirigid scope and remove the scope of the wire.  I then advanced a 12/14 French ureteral access sheath x28 cm over the second wire and into the proximal ureter removing the inner portion and the wire.  I then began the process of removing all the fragments from the lower pole.  This was accomplished using the encompass basket.  All the stone fragments were small enough that they did not need to be fragmented any further.  The majority of the stone fragments were removed.  I then irrigated out the lower pole to ensure that all the smaller fragments were in the renal pelvis and thus in a position to pass.  Once all the large fragments had been completely removed I slowly backed out the scope removing the access sheath simultaneously.  Again the ureter was atraumatic with no additional notable abnormalities.  I then slowly advanced a 24 cm x 6 French double-J ureteral stent over the wire under  fluoroscopic guidance and once the stent was noted to be well within the renal pelvis I advanced it to the urethral meatus and slowly backed out the wire.  Once the wire been completely removed the stent popped into the bladder.  The bladder was subsequently drained.  Good position of the stent was noted by  fluoroscopy.  I then placed a B&O suppository in the patient's rectum and she was subsequently extubated return to the PACU in good condition.  Disposition: The patient will be scheduled for follow-up of her stent early next week.  Crist FatBenjamin W. Caedan Sumler, M.D.

## 2018-08-19 NOTE — H&P (Signed)
Patient seen today in follow-up. On April 7th, she was seen in the ED for fevers of upto 104 and minimal complaints. Fever w/u demonstrated a 1.2 cm left obstructing stone. She was stented and improved. She has not followed up.   General: The patient presents today, looking great. She is done well despite the fact that she's had a stent in for now 6 months. She has not been treated for any infection spirits she is having some urinary urgency and incontinence. No fevers or chills.     ALLERGIES: None   MEDICATIONS: None   GU PSH: Cystoscopy Insert Stent, Left - 10/31/2017    NON-GU PSH: None   GU PMH: None   NON-GU PMH: Anxiety Depression    FAMILY HISTORY: Cancer - Mother Dementia - Mother prostate cancer in father - Father    Notes: 0 children   SOCIAL HISTORY: Marital Status: Single Preferred Language: English; Ethnicity: Not Hispanic Or Latino; Race: White Current Smoking Status: Patient smokes. Smokes 1/2 pack per day.   Tobacco Use Assessment Completed: Used Tobacco in last 30 days? Drinks 4+ caffeinated drinks per day.    REVIEW OF SYSTEMS:    GU Review Female:   Patient reports frequent urination, get up at night to urinate, and trouble starting your stream. Patient denies hard to postpone urination, burning /pain with urination, leakage of urine, stream starts and stops, have to strain to urinate, and being pregnant.  Gastrointestinal (Upper):   Patient reports indigestion/ heartburn. Patient denies nausea and vomiting.  Gastrointestinal (Lower):   Patient reports diarrhea and constipation.   Constitutional:   Patient reports weight loss. Patient denies fever, night sweats, and fatigue.  Skin:   Patient denies skin rash/ lesion and itching.  Eyes:   Patient denies blurred vision and double vision.  Ears/ Nose/ Throat:   Patient denies sore throat and sinus problems.  Hematologic/Lymphatic:   Patient reports easy bruising. Patient denies swollen glands.  Cardiovascular:    Patient denies leg swelling and chest pains.  Respiratory:   Patient reports cough. Patient denies shortness of breath.  Endocrine:   Patient denies excessive thirst.  Musculoskeletal:   Patient denies back pain and joint pain.  Neurological:   Patient denies headaches and dizziness.  Psychologic:   Patient reports depression and anxiety.    VITAL SIGNS:      05/30/2018 10:35 AM  Weight 115 lb / 52.16 kg  Height 65 in / 165.1 cm  BP 133/75 mmHg  Pulse 100 /min  Temperature 97.0 F / 36.1 C  BMI 19.1 kg/m   MULTI-SYSTEM PHYSICAL EXAMINATION:    Constitutional: Well-nourished. No physical deformities. Normally developed. Good grooming.  Respiratory: Normal breath sounds. No labored breathing, no use of accessory muscles.   Cardiovascular: Regular rate and rhythm. No murmur, no gallop. Normal temperature, normal extremity pulses, no swelling, no varicosities.   Gastrointestinal: No mass, no tenderness, no rigidity, non obese abdomen.     PAST DATA REVIEWED:  Source Of History:  Patient  Records Review:   Previous Hospital Records, Previous Patient Records, POC Tool  Urine Test Review:   Urinalysis  X-Ray Review: C.T. Abdomen/Pelvis: Reviewed Films. Discussed With Patient.     PROCEDURES:          Urinalysis w/Scope Dipstick Dipstick Cont'd Micro  Color: Yellow Bilirubin: Neg mg/dL WBC/hpf: >71/IRC  Appearance: Cloudy Ketones: Neg mg/dL RBC/hpf: NS (Not Seen)  Specific Gravity: 1.015 Blood: 1+ ery/uL Bacteria: Many (>50/hpf)  pH: 7.0 Protein: 1+ mg/dL  Cystals: NS (Not Seen)  Glucose: Neg mg/dL Urobilinogen: 0.2 mg/dL Casts: NS (Not Seen)    Nitrites: Neg Trichomonas: Not Present    Leukocyte Esterase: 3+ leu/uL Mucous: Not Present      Epithelial Cells: 0 - 5/hpf      Yeast: NS (Not Seen)      Sperm: Not Present    ASSESSMENT:      ICD-10 Details  1 GU:   Acute Cystitis/UTI - N30.00   2   Renal and ureteral calculus - N20.2    PLAN:           Orders Labs Urine  Culture          Schedule Return Visit/Planned Activity: ASAP - Schedule Surgery          Document Letter(s):  Created for Patient: Clinical Summary         Notes:   The patient has a 13 mm stone in the right UPJ and subsequent developed urosepsis. I stented her over 6 months ago. She was lost to follow-up and had transportation issues as well as other social issues preventing her from getting to the doctor. Today she presents with her sister who is her DPOAE as well as her niece. They are her social support. We discussed treatment options including PCNL versus staged ureteroscopy. I discussed within the potential complication of having an encrusted stent. We discussed numerous ureteroscopy versus trying to obtain stone free status after 1 PCNL. At this point, our plan is to proceed with ureteroscopy and as much as we can in one setting, and reschedule her if necessary for a staged procedure.

## 2018-08-20 ENCOUNTER — Encounter (HOSPITAL_COMMUNITY): Payer: Self-pay | Admitting: Urology

## 2019-03-23 ENCOUNTER — Other Ambulatory Visit: Payer: Self-pay

## 2019-03-23 ENCOUNTER — Encounter (HOSPITAL_COMMUNITY): Payer: Self-pay | Admitting: Emergency Medicine

## 2019-03-23 ENCOUNTER — Emergency Department (HOSPITAL_COMMUNITY)
Admission: EM | Admit: 2019-03-23 | Discharge: 2019-03-24 | Disposition: A | Discharge status: Home / Self Care | Payer: Self-pay | Attending: Emergency Medicine | Admitting: Emergency Medicine

## 2019-03-23 DIAGNOSIS — M79672 Pain in left foot: Secondary | ICD-10-CM | POA: Insufficient documentation

## 2019-03-23 DIAGNOSIS — Z5321 Procedure and treatment not carried out due to patient leaving prior to being seen by health care provider: Secondary | ICD-10-CM | POA: Insufficient documentation

## 2019-03-23 NOTE — ED Triage Notes (Addendum)
approx 3 hours ago, Pt was carrying a kitchen chair inside and the storm door caught the back of her L foot cutting her heel. Last tetanus was last year. Bleeding controlled with dressing. pts caregiver cleaned wound and gave patient tylenol around 2200

## 2020-02-01 ENCOUNTER — Ambulatory Visit: Payer: Self-pay | Admitting: Family Medicine

## 2020-02-12 IMAGING — DX DG CHEST 1V PORT
1 series · 1 of 1 positions shown · non-contrast
Comparison: 10/31/2017.

CLINICAL DATA: Post op for Infected left ureteral stone with sepsis

EXAM:
PORTABLE CHEST 1 VIEW

[chest ap]
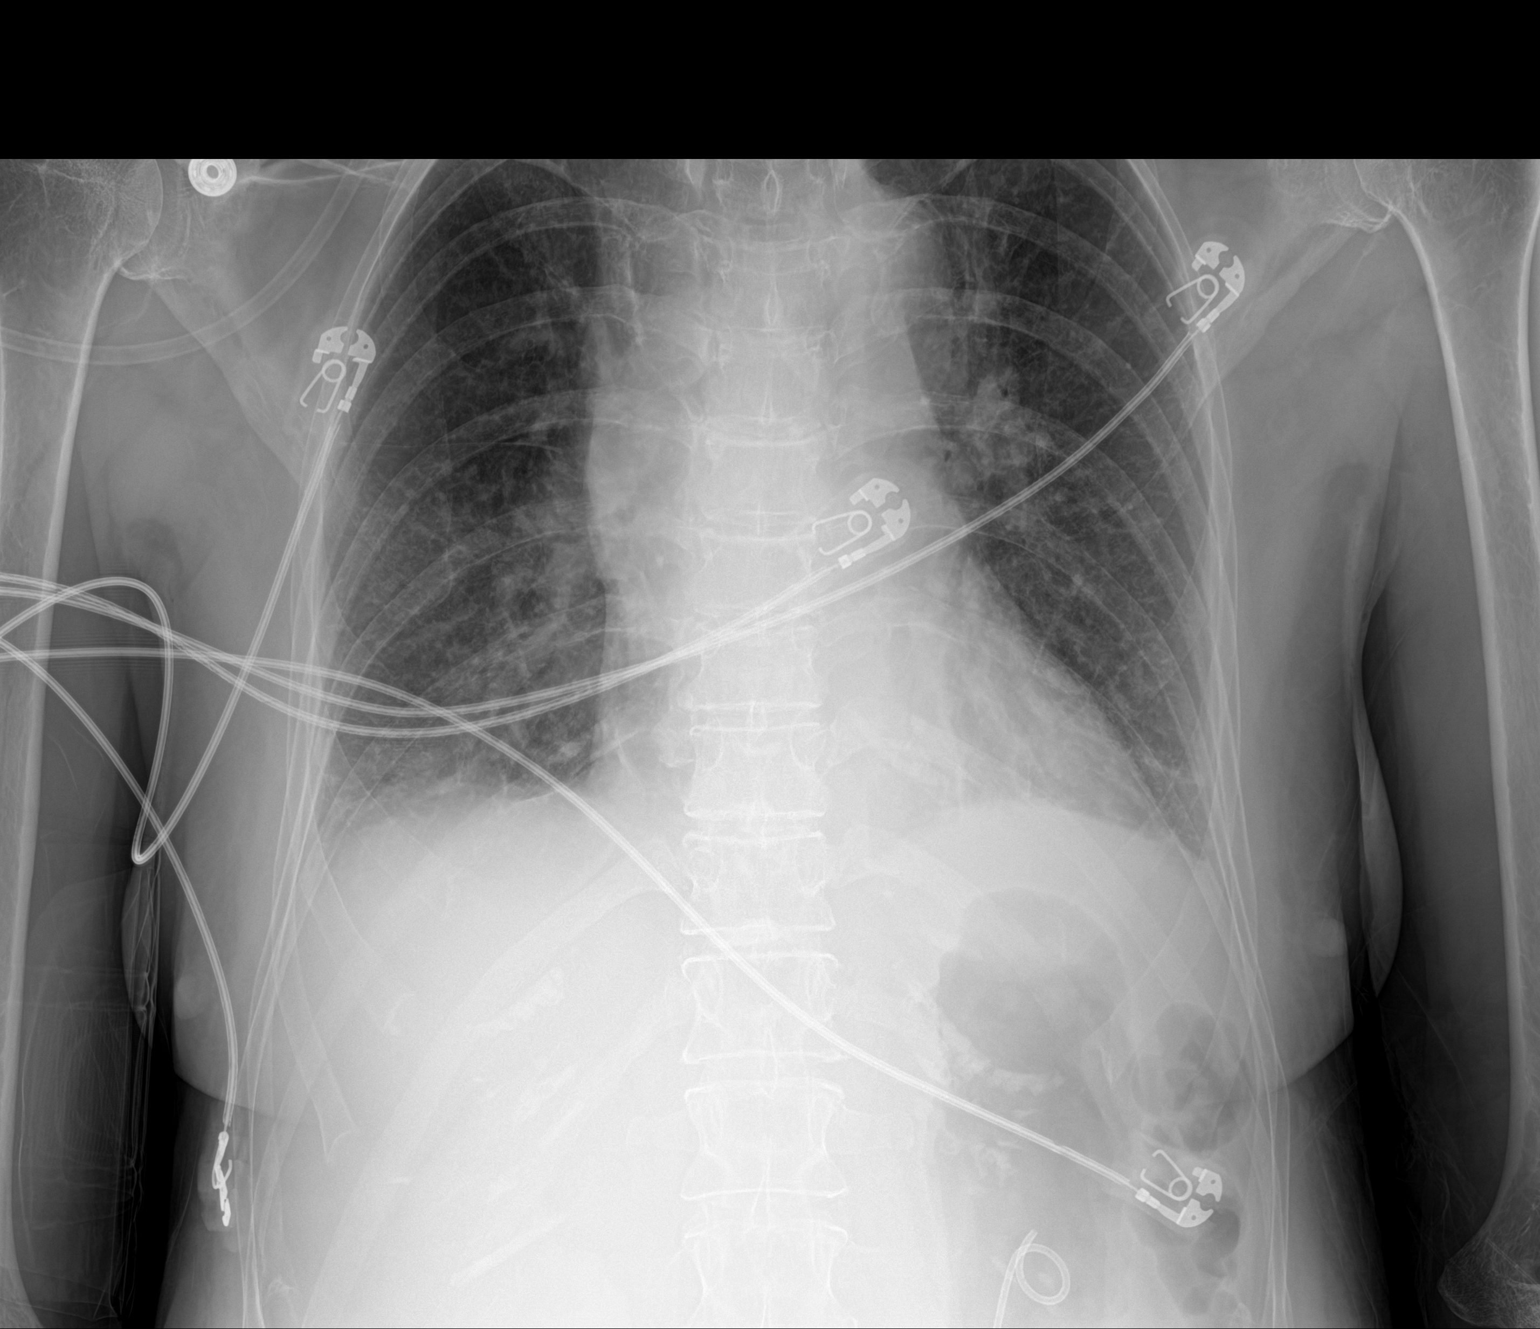

[1 of 1 positions shown; findings below may reference images not displayed]

FINDINGS: Bibasilar subsegmental atelectasis with effusions. No consolidation
or edema. Partially visualized LEFT pigtail catheter. Cardiac
silhouette within normal limits for degree of inspiration.
IMPRESSION: Worsening aeration. Bibasilar subsegmental atelectasis with
effusions. No consolidation or edema.

## 2020-02-20 ENCOUNTER — Ambulatory Visit: Payer: Self-pay | Admitting: Family Medicine

## 2020-02-27 ENCOUNTER — Encounter: Payer: Self-pay | Admitting: Family Medicine

## 2020-02-27 ENCOUNTER — Ambulatory Visit (INDEPENDENT_AMBULATORY_CARE_PROVIDER_SITE_OTHER): Payer: Self-pay | Admitting: Family Medicine

## 2020-02-27 ENCOUNTER — Other Ambulatory Visit: Payer: Self-pay

## 2020-02-27 VITALS — BP 118/60 | HR 82 | Wt 156.0 lb

## 2020-02-27 DIAGNOSIS — F5105 Insomnia due to other mental disorder: Secondary | ICD-10-CM

## 2020-02-27 DIAGNOSIS — F0281 Dementia in other diseases classified elsewhere with behavioral disturbance: Secondary | ICD-10-CM

## 2020-02-27 DIAGNOSIS — F02818 Dementia in other diseases classified elsewhere, unspecified severity, with other behavioral disturbance: Secondary | ICD-10-CM

## 2020-02-27 DIAGNOSIS — Z8659 Personal history of other mental and behavioral disorders: Secondary | ICD-10-CM

## 2020-02-27 DIAGNOSIS — Z0189 Encounter for other specified special examinations: Secondary | ICD-10-CM

## 2020-02-27 DIAGNOSIS — Z789 Other specified health status: Secondary | ICD-10-CM

## 2020-02-27 MED ORDER — TRAZODONE HCL 50 MG PO TABS: 25.0000 mg | ORAL_TABLET | Freq: Every evening | ORAL | 2 refills | Status: DC | PRN

## 2020-02-27 NOTE — Patient Instructions (Addendum)
It was a pleasure to see you today!  Thank you for choosing [facility] for your primary care.  Christina Everett was seen for establishing care and insomnia.   Our plans for today were:  Please call one of the offices below to schedule a visit with therapist to discuss psychiatric history.   I have prescribed a medication called Trazodone to help sleep. You can start with taking 1/2 tablet and increase to a whole tablet after three days if symptoms do not improve.   I have also referred you to our chronic care management team who helps with managing patient's health who have multiple health diagnoses.    We have recommended you for our [facility] who focus on healthcare for individuals 65 years and older.   To keep you healthy, please keep in mind the following health maintenance items that you are due for:   1. Pap Smear  2.  Colonoscopy  3. Mammogram 4. DEXA Scan  5. Hepatitis C screening   You should return to our clinic in 1 week for follow up.   Best Wishes,   Dr. Neita Garnet    Psychiatry Resource List (Adults and Children) Most of these providers will take Medicaid. please consult your insurance for a complete and updated list of available providers. When calling to make an appointment have your insurance information available to confirm you are covered.   [facility]  [address] [place], [place] [phone] Crisis [phone]   [facility]:   [facility]: [address]     [phone]   [facility]: [address] [place],        [phone] [place]: [address],    [phone] Kath[facility]ne Sharper: [address],                   [phone] Children: [facility] [address]         [phone]   [facility]  (Psychiatry only; Adults /children 12 and over, will take Medicaid)  [address] Laurell Josephs [address], [place], [place] [address]       [phone]   [facility] (Psychiatry & counseling ; adults & children ; will take Medicaid [address]  [place] [address]   Go on-line to complete referral ( [email or web address] [phone]    (Spanish therapist)  [facility]  Psychiatry & counseling; Adults and children;  Call Registration prior to scheduling an appointment [phone] [address]    [place], [place] [address]    [phone]  [facility] (Psychiatry & counseling; adults & children; Medicare no Medicaid)  [address]   [place], [place]  [address]      [phone]    [facility] (up to age 37)  Psychiatry & counseling ,will take Medicaid, must do counseling to receive psychiatry services  [address] [place] [address]        [phone]  [facility] (Psychiatry & counseling; adults & children; will take Medicaid) Will need a referral from provider [address],  [place], [place]  [phone]   [facility] --- Walk-In Mon-Friday 8am-3pm ( will take Medicaid, Psychiatry, Adults & children,  [address] [place], [place]   [phone]   [facility], Walk-in M-F 8am-12pm and  1pm -3pm   (Counseling, Psychiatry, will take Medicaid, adults & children)  [address] [place], [place]  [phone]
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
## 2020-02-28 ENCOUNTER — Telehealth: Payer: Self-pay | Admitting: *Deleted

## 2020-02-28 NOTE — Chronic Care Management (AMB) (Signed)
  Care Management   Note  02/28/2020 Name: Karter Haire MRN: [id number] DOB: [birth year]  Gladyes Kudo is a 64 y.o. year old female who is a primary care patient of Simmons-Robinson, Tawanna Cooler, MD. I reached out to [employer] by phone today in response to a referral sent by Ms. Marlane Hatcher health plan.    Ms. Mahlum was given information about care management services today including:  1. Care management services include personalized support from designated clinical staff supervised by her physician, including individualized plan of care and coordination with other care providers 2. 24/7 contact phone numbers for assistance for urgent and routine care needs. 3. The patient may stop care management services at any time by phone call to the office staff.  Legal Guardian Lajuana Ripple  verbally agreed to assistance and services provided by embedded care coordination/care management team today. Per Legal guardian Lavanya Roa she prefers that Sammuel Hines call Kizzie Ide [phone] patient is currently staying with Marylene Land at this time. She would also like to take Ruthann Cancer and Nicholas Lose off of DPR and add Marylene Land. Explained to [employer] Guardian of Elsia Lasota to change HIPPA information when she comes into office.   Follow up plan: Telephone appointment with care management team member scheduled for:03/08/2020.  Gwenevere Ghazi  Care Guide, Embedded Care Coordination [facility]  [place], [place] [address] Direct Dial: [phone] [email or web address] Website: [email or web address]

## 2020-02-29 DIAGNOSIS — Z8659 Personal history of other mental and behavioral disorders: Secondary | ICD-10-CM

## 2020-02-29 DIAGNOSIS — Z789 Other specified health status: Secondary | ICD-10-CM | POA: Insufficient documentation

## 2020-02-29 DIAGNOSIS — F5105 Insomnia due to other mental disorder: Secondary | ICD-10-CM | POA: Insufficient documentation

## 2020-02-29 HISTORY — DX: Personal history of other mental and behavioral disorders: Z86.59

## 2020-02-29 NOTE — Assessment & Plan Note (Signed)
Patient previously seen at [facility] and has not been in years. Unclear as to what previous diagnosis was or what medications she was prescribed. Patient will need to re-establish with psychiatric care for further evaluation of behavior described in today's visit.  - family members given information for [place] behavior [facility] and therapist resources list given

## 2020-02-29 NOTE — Assessment & Plan Note (Signed)
Description of sleep disturbance makes psychiatric component high on differential for cause of insomnia.  Prescribed 25mg  Trazodone qHS  Counseled family on sleep hygiene techniques, both sister and niece verbalized understanding

## 2020-02-29 NOTE — Assessment & Plan Note (Signed)
Patient and family present needing help with acquiring personal identification information in order to apply for financial programs, receive income, etc - chronic care management referral

## 2020-03-08 ENCOUNTER — Ambulatory Visit: Payer: Self-pay | Admitting: Licensed Clinical Social Worker

## 2020-03-08 ENCOUNTER — Telehealth: Payer: Self-pay | Admitting: Licensed Clinical Social Worker

## 2020-03-08 ENCOUNTER — Other Ambulatory Visit: Payer: Self-pay

## 2020-03-08 ENCOUNTER — Encounter: Payer: Self-pay | Admitting: Licensed Clinical Social Worker

## 2020-03-08 DIAGNOSIS — Z7189 Other specified counseling: Secondary | ICD-10-CM

## 2020-03-08 DIAGNOSIS — Z789 Other specified health status: Secondary | ICD-10-CM

## 2020-03-08 DIAGNOSIS — Z636 Dependent relative needing care at home: Secondary | ICD-10-CM

## 2020-03-08 NOTE — Chronic Care Management (AMB) (Signed)
  Social Work Care Management  Unsuccessful Phone Outreach   03/08/2020 Name: [name] MRN: [id number] DOB: 1955-08-18  Reason for referral : Care Coordination (assess needs and caregiver support)   Christina Everett is a 64 y.o. year old female who sees Simmons-Robinson, Tawanna Cooler, MD for primary care.    LCSW called patient's sister Debbora Lacrosse ( also called Bluford Kaufmann at [phone] as Bonni requested LCSW talk to Marylene Land states patient is currently living with her) to assess needs and barriers reference the above referral. Telephone outreach was unsuccessful to both contacts. A HIPPA compliant phone message was left for the patient providing contact information and requesting a return call.  Plan:  If no return call is received. LCSW will call again in 5 to 7 days.  Review of patient status, including review of consultants reports, relevant laboratory and other test results, and collaboration with appropriate care team members and the patient's provider was performed as part of comprehensive patient evaluation and provision of care management services.   Sammuel Hines, LCSW Care Management & Coordination  [facility]   [phone] 9:40 AM

## 2020-03-08 NOTE — Chronic Care Management (AMB) (Addendum)
Care Management   Clinical Social Work initial Note  03/08/2020 Name: Christina Everett MRN: [id number] DOB: November 16, 1955 Christina Everett is a 64 y.o. year old female who sees Simmons-Robinson, Christina Sheer, MD for primary care. The Care Management team was consulted by PCP to assist the patient with [employer]  and Caregiver Stress. Psychosocial Support Level of Care Concerns.  LCSW reached out to Christina Everett sister and niece today by phone to introduce self, assess needs and barriers to care.   Assessment: Patient is pleasant and engaged in conversation. States how much she enjoys living with niece Christina Everett.  Experiencing  difficulty psychosocial needs with  which seems to be exacerbated by family dynamics.  LSCW will work with patient and family to connect them with resources to met unmet needs. See care plan below for details. Plan: LCSW will F/U with patient and family in one week  Review of patient status, including review of consultants reports, relevant laboratory and other test results, and collaboration with appropriate care team members and the patient's provider was performed as part of comprehensive patient evaluation and provision of chronic care management services.    Advance Directive Status: N See Care  for related entries.  SDOH (Social Determinants of Health) assessments performed: Yes:  SDOH Interventions      Most Recent Value  SDOH Interventions  SDOH Interventions for the Following Domains Depression  Depression Interventions/Treatment  Referral to Psychiatry       ; Goals Addressed             This Visit's Progress    Advance Directive       CARE PLAN ENTRY (see longitudinal plan of care for additional care plan information)  Current Barriers:  Patient does not have an Advance Directive Patient and caregiver acknowledges deficits, education and support in order to complete this document Clinical Social Work Goal(s): Over the next 30 to 60 days,  the patient  and caregiver will review and complete Advance Directive packet, have notarized and provide a copy to provider office Interventions provided by LCSW: Assessed understanding of Advance Directives A voluntary discussion about advanced care planning including importance of advanced directives, healthcare proxy and living will was discussed with the patient, sister and niece.  patient and caregivers have educational information on Advance Directives as well as an Forensic scientist packet Patient Self Care Activities:  Is able to complete documentation with help of family Able to identify Christina Everett patient and caregiver will call LCSW with questions Initial goal documentation     Caregiver stress       CARE PLAN ENTRY (see longitudinal plan of care for additional care plan information)  Current Barriers:  Patient and caregiver needs community resources support (ID for patient, Medicaid, SS disability and concerns with family dynamics)  Patient and caregiver acknowledges deficits and needs support, education and care coordination in order to meet this unmet need  Clinical Goal(s)  Over the next 30 days, patient and caregiver will work with various community agency to meet needs Interventions provided by LCSW:  Assessment of needs and barriers to care as well as how impacting    Discussed and provided patient's caregiver with information about The department of Social Services, Health visitor Aid  Reviewed process with agencies discussed and options  Motivational Interviewing and emotional support Patient Self Care Activities & Deficits:  Patient is unable to independently navigate community resource options without care coordination support  Patient's caregiver is motivated to resolve concern and is able to contact agencies as discussed today Initial goal documentation     mental health provider       CARE PLAN ENTRY (see  longitudinal plan of care for additional care plan information)  Current Barriers:  Patient  with Bipolar Disorder and Mentally disabled acknowledges deficits with connecting to mental health provider for reestablishing care   Patient and caregiver needs Support, Education, and Care Coordination in order to meet unmet mental health needs  Patient previously seen at [place] Work Goal(s):  Over the next 30 days, patient's support system will work with SW to reconnected for ongoing counseling and medication.  Interventions:  Assessed patient's  previous treatment, needs and barriers to care Discussed several options for long term counseling and medication management based on need and insurance.  (family would like patient to return to [facility]  ) Contact information provided for [facility] focused intervention provided  Patient Self Care Activities & Deficits:  Patient is unable to independently navigate community resource options without care coordination support Patient's caregiver is able to implement clinical interventions discussed today  Initial goal documentation        Outpatient Encounter Medications as of 03/08/2020  Medication Sig Note   acetaminophen (TYLENOL) 500 MG tablet Take 500 mg by mouth every 6 (six) hours as needed for moderate pain or headache.     dextromethorphan 15 MG/5ML syrup Take 5 mLs by mouth at bedtime. 08/19/2018: Pt is currently out    diphenhydrAMINE (BENADRYL ALLERGY) 25 MG tablet Take 25 mg by mouth at bedtime.     Multiple Vitamin (MULTIVITAMIN WITH MINERALS) TABS tablet Take 1 tablet by mouth daily.    phenazopyridine (PYRIDIUM) 200 MG tablet Take 1 tablet (200 mg total) by mouth 3 (three) times daily as needed for pain.    traZODone (DESYREL) 50 MG tablet Take 0.5-1 tablets (25-50 mg total) by mouth at bedtime as needed for sleep.    No facility-administered encounter medications on file as of 03/08/2020.      Casimer Lanius,  [place] [facility] [place]   [phone] 12:32 PM     I have reviewed this visit and agree with the documentation.  Eulis Foster, MD [place], PGY-2 [phone]

## 2020-03-12 ENCOUNTER — Encounter: Payer: Self-pay | Admitting: Family Medicine

## 2020-03-12 ENCOUNTER — Other Ambulatory Visit: Payer: Self-pay

## 2020-03-12 ENCOUNTER — Ambulatory Visit (INDEPENDENT_AMBULATORY_CARE_PROVIDER_SITE_OTHER): Payer: Self-pay | Admitting: Family Medicine

## 2020-03-12 DIAGNOSIS — F5105 Insomnia due to other mental disorder: Secondary | ICD-10-CM

## 2020-03-12 MED ORDER — TERBINAFINE HCL 250 MG PO TABS: 250.0000 mg | ORAL_TABLET | Freq: Every day | ORAL | 0 refills | Status: DC

## 2020-03-12 MED ORDER — QUETIAPINE FUMARATE 25 MG PO TABS: 25.0000 mg | ORAL_TABLET | Freq: Every day | ORAL | 0 refills | Status: DC

## 2020-03-12 NOTE — Progress Notes (Signed)
    SUBJECTIVE:   CHIEF COMPLAINT / HPI: insomnia and more irritable   Patient states that she is doing well and is not having any pain during visit. Patient's niece and sister are present and provide most of history for this encounter. They state that the patient continues to have insomnia after trying the trazodone prescription from the last visit. Patient is reported to also seem more irritable. Niece and sister add that the patient has continued to remove her clothing at inappropriate times, refuses to bathe  and forgets to put on clothes prior to leaving the house. They report attempting to have patient establish with behavioral health but will need her identification paperwork prior to getting this care established. Patient continues to have good appetite. Patient continues to stay awake throughout the night into the early morning. She denies feeling tired. Patient's family states they have tried melatonin, herbal teas, NyQuil with no improvement in her ability to sleep through the night.   PERTINENT  PMH / PSH:  Dementia   OBJECTIVE:   BP 120/70   Pulse 90   Wt 126 lb (57.2 kg)   SpO2 99%   BMI 21.63 kg/m    General: thin  female appearing stated age in no acute distress HEENT: MMM, no oral lesions noted Cardio: Normal S1 and S2, no S3 or S4. Rhythm is regular, No murmurs or rubs.  Bilateral radial pulses palpable Pulm: Clear to auscultation bilaterally, no crackles, no wheezing Abdomen: Bowel sounds normal. Abdomen soft and non-tender. Neuro: pt alert , oriented to self and location  Psych: quiet most of exam, intermittent eye contact, slightly disheveled appearance   ASSESSMENT/PLAN:   Insomnia disorder, with non-sleep disorder mental comorbidity, persistent Continued insomnia despite trazodone and prior natural sleep aids.  - prescribed Seroqel 25mg  qHS  - patient to follow up in 4 weeks      , MD [facility]

## 2020-03-12 NOTE — Patient Instructions (Addendum)
It was a pleasure to see you today!  Thank you for choosing [facility] for your primary care.  Christina Everett was seen for follow up for insomnia.   Our plans for today were:  I have stopped the Trazodone medication. Please start taking 25mg  of Seroquel at night to help with sleep.   Please follow up in 4 weeks to see how this medication is helping.   You should return to our clinic in 4 weeks.   Best Wishes,   Dr.     Trazodone tablets What is this medicine? TRAZODONE (TRAZ oh done) is used to treat depression. This medicine may be used for other purposes; ask your health care provider or pharmacist if you have questions. COMMON BRAND NAME(S): Desyrel What should I tell my health care provider before I take this medicine? They need to know if you have any of these conditions:  attempted suicide or thinking about it  bipolar disorder  bleeding problems  glaucoma  heart disease, or previous heart attack  irregular heart beat  kidney or liver disease  low levels of sodium in the blood  an unusual or allergic reaction to trazodone, other medicines, foods, dyes or preservatives  pregnant or trying to get pregnant  breast-feeding How should I use this medicine? Take this medicine by mouth with a glass of water. Follow the directions on the prescription label. Take this medicine shortly after a meal or a light snack. Take your medicine at regular intervals. Do not take your medicine more often than directed. Do not stop taking this medicine suddenly except upon the advice of your doctor. Stopping this medicine too quickly may cause serious side effects or your condition may worsen. A special MedGuide will be given to you by the pharmacist with each prescription and refill. Be sure to read this information carefully each time. Talk to your pediatrician regarding the use of this medicine in children. Special care may be needed. Overdosage: If you  think you have taken too much of this medicine contact a poison control center or emergency room at once. NOTE: This medicine is only for you. Do not share this medicine with others. What if I miss a dose? If you miss a dose, take it as soon as you can. If it is almost time for your next dose, take only that dose. Do not take double or extra doses. What may interact with this medicine? Do not take this medicine with any of the following medications:  certain medicines for fungal infections like fluconazole, itraconazole, ketoconazole, posaconazole, voriconazole  cisapride  dronedarone  linezolid  MAOIs like Carbex, Eldepryl, Marplan, Nardil, and Parnate  mesoridazine  methylene blue (injected into a vein)  pimozide  saquinavir  thioridazine This medicine may also interact with the following medications:  alcohol  antiviral medicines for HIV or AIDS  aspirin and aspirin-like medicines  barbiturates like phenobarbital  certain medicines for blood pressure, heart disease, irregular heart beat  certain medicines for depression, anxiety, or psychotic disturbances  certain medicines for migraine headache like almotriptan, eletriptan, frovatriptan, naratriptan, rizatriptan, sumatriptan, zolmitriptan  certain medicines for seizures like carbamazepine and phenytoin  certain medicines for sleep  certain medicines that treat or prevent blood clots like dalteparin, enoxaparin, warfarin  digoxin  fentanyl  lithium  NSAIDS, medicines for pain and inflammation, like ibuprofen or naproxen  other medicines that prolong the QT interval (cause an abnormal heart rhythm) like dofetilide  rasagiline  supplements like St. John's wort,  kava kava, valerian  tramadol  tryptophan This list may not describe all possible interactions. Give your health care provider a list of all the medicines, herbs, non-prescription drugs, or dietary supplements you use. Also tell them if you  smoke, drink alcohol, or use illegal drugs. Some items may interact with your medicine. What should I watch for while using this medicine? Tell your doctor if your symptoms do not get better or if they get worse. Visit your doctor or health care professional for regular checks on your progress. Because it may take several weeks to see the full effects of this medicine, it is important to continue your treatment as prescribed by your doctor. Patients and their families should watch out for new or worsening thoughts of suicide or depression. Also watch out for sudden changes in feelings such as feeling anxious, agitated, panicky, irritable, hostile, aggressive, impulsive, severely restless, overly excited and hyperactive, or not being able to sleep. If this happens, especially at the beginning of treatment or after a change in dose, call your health care professional. Christina Everett may get drowsy or dizzy. Do not drive, use machinery, or do anything that needs mental alertness until you know how this medicine affects you. Do not stand or sit up quickly, especially if you are an older patient. This reduces the risk of dizzy or fainting spells. Alcohol may interfere with the effect of this medicine. Avoid alcoholic drinks. This medicine may cause dry eyes and blurred vision. If you wear contact lenses you may feel some discomfort. Lubricating drops may help. See your eye doctor if the problem does not go away or is severe. Your mouth may get dry. Chewing sugarless gum, sucking hard candy and drinking plenty of water may help. Contact your doctor if the problem does not go away or is severe. What side effects may I notice from receiving this medicine? Side effects that you should report to your doctor or health care professional as soon as possible:  allergic reactions like skin rash, itching or hives, swelling of the face, lips, or tongue  elevated mood, decreased need for sleep, racing thoughts, impulsive  behavior  confusion  fast, irregular heartbeat  feeling faint or lightheaded, falls  feeling agitated, angry, or irritable  loss of balance or coordination  painful or prolonged erections  restlessness, pacing, inability to keep still  suicidal thoughts or other mood changes  tremors  trouble sleeping  seizures  unusual bleeding or bruising Side effects that usually do not require medical attention (report to your doctor or health care professional if they continue or are bothersome):  change in sex drive or performance  change in appetite or weight  constipation  headache  muscle aches or pains  nausea This list may not describe all possible side effects. Call your doctor for medical advice about side effects. You may report side effects to FDA at 1-800-FDA-1088. Where should I keep my medicine? Keep out of the reach of children. Store at room temperature between 15 and 30 degrees C (59 to 86 degrees F). Protect from light. Keep container tightly closed. Throw away any unused medicine after the expiration date. NOTE: This sheet is a summary. It may not cover all possible information. If you have questions about this medicine, talk to your doctor, pharmacist, or health care provider.  2020 Elsevier/Gold Standard (2018-07-05 11:46:46)

## 2020-03-13 ENCOUNTER — Other Ambulatory Visit: Payer: Self-pay | Admitting: Family Medicine

## 2020-03-13 ENCOUNTER — Telehealth: Payer: Self-pay

## 2020-03-13 MED ORDER — QUETIAPINE FUMARATE 25 MG PO TABS: 25.0000 mg | ORAL_TABLET | Freq: Every day | ORAL | 0 refills | Status: DC

## 2020-03-13 NOTE — Telephone Encounter (Signed)
Bonnie calls nurse line reporting Seroquel is not affordable and requesting an alternative medication to be sent in. Please advise.

## 2020-03-13 NOTE — Progress Notes (Signed)
Discussed calling in prescription and use of good rx coupon for seroquel at [facility] pharmacy on [address]. Patient's sister states that she is able to use GoodRx coupon app.

## 2020-03-14 NOTE — Assessment & Plan Note (Signed)
Continued insomnia despite trazodone and prior natural sleep aids.  - prescribed Seroqel 25mg  qHS  - patient to follow up in 4 weeks

## 2020-03-15 ENCOUNTER — Other Ambulatory Visit: Payer: Self-pay

## 2020-03-15 ENCOUNTER — Ambulatory Visit: Payer: Self-pay | Admitting: Licensed Clinical Social Worker

## 2020-03-15 DIAGNOSIS — Z636 Dependent relative needing care at home: Secondary | ICD-10-CM

## 2020-03-15 DIAGNOSIS — Z7189 Other specified counseling: Secondary | ICD-10-CM

## 2020-03-15 NOTE — Chronic Care Management (AMB) (Signed)
Care Management   Clinical Social Work Follow Up   03/15/2020 Name: Christina Everett MRN: [id number] DOB: 09-Jan-1956 Referred by: Ronnald Ramp, MD  Reason for referral : Care Coordination (F/U call)  Christina Everett is a 64 y.o. year old female who is a primary care patient of Simmons-Robinson, Tawanna Cooler, MD.  Reason for follow-up: assess for barriers and progress with caregiver stress. See updated care plan below .  Sister reports patient is sleeping better with Seroquel. Plan: LCSW will provide ongoing support as needed  Advance Directive Status: N See Care Plan  for related entries. ; SDOH (Social Determinants of Health) assessments performed: No needs identified   Goals Addressed            This Visit's Progress   . Advance Directive   Not on track    CARE PLAN ENTRY (see longitudinal plan of care for additional care plan information)  Current Barriers:  . Patient does not have an Proofreader . Patient and caregiver acknowledges deficits, education and support in order to complete this document Clinical Social Work Goal(s): Over the next 30 to 60 days,  . the patient and caregiver will review and complete Advance Directive packet, have notarized and provide a copy to provider office Interventions provided by LCSW: . Assessed understanding of Advance Directives . A voluntary discussion about advanced care planning including importance of advanced directives, healthcare proxy and living will was discussed with the patient, sister and niece.  . patient and caregivers have educational information on Advance Directives as well as an Engineer, production . Discussed options of legal guardian; also reminded patient's sister patient will need to have her ID in to have advance directive notarized.   Patient Self Care Activities:  . Is able to complete documentation with help of family . Able to identify Health Care Power of [facility] / Health Care Agent . patient and  caregiver will call LCSW with questions Please see past updates related to this goal by clicking on the "Past Updates" button in the selected goal     . Caregiver stress   On track    CARE PLAN ENTRY (see longitudinal plan of care for additional care plan information)  Current Barriers:  . Patient and caregiver needs community resources support (ID for patient, Medicaid, SS disability and concerns with family dynamics)  . Patient and caregiver acknowledges deficits and needs support, education and care coordination in order to meet this unmet need  . Family feeling much better with stressors due to being able to accomplish things to help patient Clinical Goal(s)  . Over the next 30 days, patient and caregiver will work with various community agency to meet needs Interventions provided by LCSW:  . Assessment of needs and barriers to care as well as how impacting    . Discussed and provided patient's caregiver with information about The department of Social Services, Designer, jewellery. . Reviewed process with agencies discussed options (decided not to move forward with Legal aid, has contacted [employer]SA for social security card) . Motivational Interviewing and emotional support Patient Self Care Activities & Deficits:  . Patient is unable to independently navigate community resource options without care coordination support  . Patient's caregiver is motivated to resolve concern and is able to contact agencies as discussed today Initial goal documentation    . mental health provider   Not on track    CARE PLAN ENTRY (see longitudinal plan of care for additional care plan information)  Current Barriers:  . Patient  with Bipolar Disorder and Mentally disabled acknowledges deficits with connecting to mental health provider for reestablishing care   . Patient and caregiver needs Support, Education, and Care Coordination in order to meet unmet mental health needs  . Patient  previously seen at [facility]  . Patient unable to go to [facility] until she has her ID,(currently working to obtain it). . PCP changed patient's medication to Seroquel which is working much better Clinical Social Work Goal(s):  Marland Kitchen Over the next 30 days, patient's support system will work with SW to reconnected for ongoing counseling and medication.  Interventions:  . Assessed patient's barriers with connecting to [facility] . Discussed several options for long term counseling and medication management based on need and insurance.  (family would like patient to return to [place]  ) . Contact information provided for [facility] . Solution focused intervention provided  Patient Self Care Activities & Deficits:  . Patient is unable to independently navigate community resource options without care coordination support . Patient's caregiver is able to implement clinical interventions discussed today  Please see past updates related to this goal by clicking on the "Past Updates" button in the selected goal        Outpatient Encounter Medications as of 03/15/2020  Medication Sig Note  . acetaminophen (TYLENOL) 500 MG tablet Take 500 mg by mouth every 6 (six) hours as needed for moderate pain or headache.    . dextromethorphan 15 MG/5ML syrup Take 5 mLs by mouth at bedtime. 08/19/2018: Pt is currently out   . diphenhydrAMINE (BENADRYL ALLERGY) 25 MG tablet Take 25 mg by mouth at bedtime.    . Multiple Vitamin (MULTIVITAMIN WITH MINERALS) TABS tablet Take 1 tablet by mouth daily.   . phenazopyridine (PYRIDIUM) 200 MG tablet Take 1 tablet (200 mg total) by mouth 3 (three) times daily as needed for pain.   Marland Kitchen QUEtiapine (SEROQUEL) 25 MG tablet Take 1 tablet (25 mg total) by mouth at bedtime.    No facility-administered encounter medications on file as of 03/15/2020.   Review of patient status, including review of consultants reports, relevant laboratory and other test results, and collaboration with appropriate care  team members and the patient's provider was performed as part of comprehensive patient evaluation and provision of care management services.    Sammuel Hines, LCSW Care Management & Coordination  [facility] / [facility]   [phone] 2:25 PM

## 2020-03-21 ENCOUNTER — Ambulatory Visit: Payer: Self-pay | Attending: Family Medicine | Admitting: Physical Therapy

## 2020-03-21 ENCOUNTER — Ambulatory Visit: Payer: Self-pay | Admitting: Licensed Clinical Social Worker

## 2020-03-21 ENCOUNTER — Ambulatory Visit (INDEPENDENT_AMBULATORY_CARE_PROVIDER_SITE_OTHER): Payer: Self-pay | Admitting: Family Medicine

## 2020-03-21 ENCOUNTER — Other Ambulatory Visit: Payer: Self-pay

## 2020-03-21 VITALS — BP 114/78 | HR 88

## 2020-03-21 DIAGNOSIS — F17210 Nicotine dependence, cigarettes, uncomplicated: Secondary | ICD-10-CM

## 2020-03-21 DIAGNOSIS — F0391 Unspecified dementia with behavioral disturbance: Secondary | ICD-10-CM

## 2020-03-21 DIAGNOSIS — Z550 Illiteracy and low-level literacy: Secondary | ICD-10-CM

## 2020-03-21 DIAGNOSIS — Z0189 Encounter for other specified special examinations: Secondary | ICD-10-CM

## 2020-03-21 DIAGNOSIS — Z8659 Personal history of other mental and behavioral disorders: Secondary | ICD-10-CM

## 2020-03-21 DIAGNOSIS — H547 Unspecified visual loss: Secondary | ICD-10-CM

## 2020-03-21 DIAGNOSIS — I7121 Aneurysm of the ascending aorta, without rupture: Secondary | ICD-10-CM

## 2020-03-21 DIAGNOSIS — F4310 Post-traumatic stress disorder, unspecified: Secondary | ICD-10-CM

## 2020-03-21 DIAGNOSIS — F5105 Insomnia due to other mental disorder: Secondary | ICD-10-CM

## 2020-03-21 DIAGNOSIS — R94120 Abnormal auditory function study: Secondary | ICD-10-CM

## 2020-03-21 DIAGNOSIS — F79 Unspecified intellectual disabilities: Secondary | ICD-10-CM

## 2020-03-21 DIAGNOSIS — I712 Thoracic aortic aneurysm, without rupture: Secondary | ICD-10-CM

## 2020-03-21 DIAGNOSIS — Z1159 Encounter for screening for other viral diseases: Secondary | ICD-10-CM

## 2020-03-21 DIAGNOSIS — Z636 Dependent relative needing care at home: Secondary | ICD-10-CM

## 2020-03-21 DIAGNOSIS — R911 Solitary pulmonary nodule: Secondary | ICD-10-CM

## 2020-03-21 DIAGNOSIS — Z114 Encounter for screening for human immunodeficiency virus [HIV]: Secondary | ICD-10-CM

## 2020-03-21 DIAGNOSIS — R413 Other amnesia: Secondary | ICD-10-CM

## 2020-03-21 DIAGNOSIS — R2681 Unsteadiness on feet: Secondary | ICD-10-CM

## 2020-03-21 DIAGNOSIS — F411 Generalized anxiety disorder: Secondary | ICD-10-CM

## 2020-03-21 DIAGNOSIS — I700 Atherosclerosis of aorta: Secondary | ICD-10-CM

## 2020-03-21 DIAGNOSIS — F930 Separation anxiety disorder of childhood: Secondary | ICD-10-CM

## 2020-03-21 MED ORDER — DONEPEZIL HCL 5 MG PO TABS: 5.0000 mg | ORAL_TABLET | Freq: Every day | ORAL | 0 refills | Status: DC

## 2020-03-21 MED ORDER — LORAZEPAM 1 MG PO TABS: ORAL_TABLET | ORAL | 0 refills | Status: DC

## 2020-03-21 MED ORDER — LORAZEPAM 1 MG PO TABS: ORAL_TABLET | ORAL | 0 refills | Status: AC

## 2020-03-21 NOTE — Progress Notes (Signed)
S: Pharmacy consulted to complete medication reconcillation for geriatric clinic. Patient arrives today in good spirits accompanied with her sister and niece. Reports she is currently only taking quetiapine 25mg  nightly for sleep, which they state is helping, and uses acetaminophen occasionally as needed for headaches. Family states she previously tried trazodone for sleep which did not help.   Family prefers to fill prescriptions at [facility]) because the [facility] there are cheaper than at [facility].   Plan: - Continue current medications: quetiapine 25mg  QHS, acetaminophen prn headache - Removed medications that she is no longer taking from her med list.  - Preferred pharmacy has been changed to [facility]).  [employer], PharmD PGY1 Pharmacy Resident 03/21/2020 3:15 PM

## 2020-03-21 NOTE — Chronic Care Management (AMB) (Signed)
Care Management Clinical Social Work  [facility] Psychosocial     03/21/2020 Name: Christina Everett MRN: [id number] DOB: 08-04-1955  Christina Everett is a 64 y.o. year old female who sees Simmons-Robinson, Tawanna Cooler, MD for primary care.  Referred by Dr. McDiarmid for assessing psychosocial needs, support and barriers. (Look under patient's history for social information from assessment). Patient  was accompanied by her sister and niece.  Main concern is caregiver stress and getting needed documents for patient due to concerns with family dynamics. LCSW will continue work with patient and family for ongoing needs.  Advance Directive Status: N See Care Plan for related entries. Goals Addressed            This Visit's Progress   . Advance Directive   Not on track    CARE PLAN ENTRY (see longitudinal plan of care for additional care plan information)  Current Barriers:  . Patient does not have an Proofreader . Patient and caregiver acknowledges deficits, education and support in order to complete this document . Unable to complete until patient has her ID Clinical Social Work Goal(s): Over the next 30 to 60 days,  . the patient and caregiver will review and complete Advance Directive packet, have notarized and provide a copy to provider office Interventions provided by LCSW: . Assessed understanding of Advance Directives . A voluntary discussion about advanced care planning including importance of advanced directives, healthcare proxy and living will was discussed with the patient, sister and niece.  . patient and caregivers have educational information on Advance Directives as well as an Engineer, production . Discussed options of legal guardian; also reminded patient's sister patient will need to have her ID in to have advance directive notarized.   Patient Self Care Activities:  . Is able to complete documentation with help of family . Able to identify Health Care Power of [facility] /  Health Care Agent . patient and caregiver will call LCSW with questions Please see past updates related to this goal by clicking on the "Past Updates" button in the selected goal     . Caregiver stress   On track    CARE PLAN ENTRY (see longitudinal plan of care for additional care plan information)  Current Barriers:  . Patient and caregiver needs community resources support (ID for patient, Medicaid, SS disability and concerns with family dynamics)  . Patient and caregiver acknowledges deficits and needs support, education and care coordination in order to meet this unmet need  . Family feeling much better with stressors due to being able to accomplish things to help patient . Has requested SS card for patient,  Clinical Goal(s)  . Over the next 30 days, patient and caregiver will work with various community agency to meet needs Interventions provided by LCSW:  . Assessment of needs and barriers to care as well as how impacting    . Discussed and provided patient's caregiver with information about The department of Social Services, Designer, jewellery. . Reviewed process with agencies discussed options (decided not to move forward with Legal aid, has contacted [employer]SA for social security card) . Motivational Interviewing and emotional support Patient Self Care Activities & Deficits:  . Patient is unable to independently navigate community resource options without care coordination support  . Patient's caregiver is motivated to resolve concern and is able to contact agencies as discussed today Please see past updates related to this goal by clicking on the "Past Updates" button in the selected  goal      Review of patient status, including review of consultants reports, relevant laboratory and other test results, and collaboration with appropriate care team members and the patient's provider was performed as part of comprehensive patient evaluation and provision of  chronic care management services.   Outpatient Encounter Medications as of 03/21/2020  Medication Sig  . acetaminophen (TYLENOL) 500 MG tablet Take 500 mg by mouth every 6 (six) hours as needed for moderate pain or headache.   . donepezil (ARICEPT) 5 MG tablet Take 1 tablet (5 mg total) by mouth at bedtime.  Marland Kitchen QUEtiapine (SEROQUEL) 25 MG tablet Take 1 tablet (25 mg total) by mouth at bedtime.   No facility-administered encounter medications on file as of 03/21/2020.   Educational information provided to patient and sister:  . 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

## 2020-03-21 NOTE — Patient Instructions (Addendum)
There is some concern that Christina Everett may have dementia that has caused her lose some of her thinking and skills.    All of her blood work tests from 03/21/20 were normal.   We recommend waiting until Christina Everett's insurance for outpatient medical care in certain before Christina Everett should have her Head CAT Scan.  She should probably wait for now on seeing the eye doctor.  I want to avoid her getting billed for the costs of these tests and specialists.  Christina Everett, Child psychotherapist, said you are working your way to getting her her social security and insurance back up and running.  We can wait until her insurance is back up and running as well.     Her memory for things you tell her, or things that just happened, is poor.  Keeping something in her memory for more than a minute may be very hard for her.    She will be able to remember things from long ago.  Dementia does not take these memories away.  Talking to her about her past, or using magazine pictures to talk about and stimulate her memory is often something people with dementia enjoy.   It can help to calm them down because they can do it without getting frustrated.   We recommended trying the medicine donepezil to see if it would help with Christina Everett's problem behaviors. We recommend trying it for two months before deciding whether it is helping.  If it is not helping after two months, it should be stopped.   The main side effect with donepezil is upsetting the stomach and intestines. Some people start losing there urine when taking donepezil.  It goes away once the medicine is stopped.  Rarely, it can make the heart beat slower and cause people to faint.  If this happens, the slow heart rate goes away as soon as we stop the donepezil.   We will send our evaluation and suggestions to Dr Neita Garnet for her review.   We would like Christina Everett to see Dr Neita Garnet in 2 months to talk about the donepezil, Seroquel (Quetiapine), and discuss some other health  suggestions.

## 2020-03-21 NOTE — Therapy (Signed)
[facility] Outpatient [facility] [address] [place], [place], [address] Phone: [phone]   Fax:  [phone]  Physical Therapy Screen  Patient Details  Name: Christina Everett MRN: [id number] Date of Birth: 08-04-1955 Referring Provider (PT): Lewie Loron MD   Encounter Date: 03/21/2020   PT End of Session - 03/21/20 1452    Visit Number 1    Number of Visits 1    Date for PT Re-Evaluation 03/21/20    PT Start Time 1425    PT Stop Time 1445    PT Time Calculation (min) 20 min    Activity Tolerance Patient tolerated treatment well    Behavior During Therapy Anxious           Past Medical History:  Diagnosis Date  . Cough    per sister on preop call of 07/12/2018, no fever Dr Marlou Porch is aware per sister and office but would still like to proceed with surgery   . Family history of adverse reaction to anesthesia    sister- makes her sick   . Kidney stone   . Mentally disabled    OLDER SISTER BONNIE IS CARETAKER AND GUARDIAN   . Sepsis (HCC)    UROSEPSIS  ;  ADMISSION  10-31-17 THRU 11-04-17   . Thoracic ascending aortic aneurysm (HCC)    4.3 cm diameter . noted in 10-31-17 CT chest in epic     Past Surgical History:  Procedure Laterality Date  . CYSTOSCOPY W/ URETERAL STENT PLACEMENT Left 10/31/2017   Procedure: CYSTOSCOPY WITH STENT REPLACEMENT;  Surgeon: Crist Fat, MD;  Location: [facility];  Service: Urology;  Laterality: Left;  . CYSTOSCOPY/URETEROSCOPY/HOLMIUM LASER/STENT PLACEMENT Left 06/29/2018   Procedure: CYSTOSCOPY WITH LITHOPAXY, LEFT RETROGRADE PYELOGRAM/HOLMIUM LASER/STENT EXCHANGE;  Surgeon: Crist Fat, MD;  Location: [facility];  Service: Urology;  Laterality: Left;  . CYSTOSCOPY/URETEROSCOPY/HOLMIUM LASER/STENT PLACEMENT Left 07/13/2018   Procedure: LEFT  URETEROSCOPY/HOLMIUM LASER/STONE EXTRACTION /STENT EXTRACTION AND EXCHANGE;  Surgeon: Crist Fat, MD;  Location: [facility];  Service: Urology;  Laterality:  Left;  . CYSTOSCOPY/URETEROSCOPY/HOLMIUM LASER/STENT PLACEMENT Left 08/19/2018   Procedure: CYSTOSCOPY, RETROGRADE,LEFT URETEROSCOPY/LEFT URETERAL STENT EXCHANGE;  Surgeon: Crist Fat, MD;  Location: [facility];  Service: Urology;  Laterality: Left;    There were no vitals filed for this visit.        [facility] PT Assessment - 03/21/20 0001      Assessment   Medical Diagnosis unsteadiness on feet    Referring Provider (PT) Lewie Loron MD            [facility] Pre-Surgical Assessment - 03/21/20 0001    5 Meter Walk Test- trial 1 4.5 sec    5 Meter Walk Test- trial 2 5 sec.     5 Meter Walk Test- trial 3 4.4 sec.    5 meter walk test average 4.63 sec    4 Stage Balance Test tolerated for:  4 sec.    4 Stage Balance Test Position 3    comment SPPB 11/12   cognitive ability affecting test. floor xfer I   Sit To Stand Test- trial 1 12 sec.    Comment < than 13.6 sec low risk for fall                    Objective measurements completed on examination: See above findings.            Clinical Impresssion Statement ;Pt is a 64 yo female presenting  to the Geriatric Clinic for PT screen.Pt is exhibits visible anxiety but was able to participate in screen with PT.  Pt also was able to descend and rise from the floor independently.  Pt does not report any falls.  Her screen predicts she is a low  fall risk 4 stage balance test.  3.54 ft/sec walking speed. Pt performed 5xSTS in 11.97  seconds which is indicative of low fall risk if under 13.6 sec.   Based on Short Physical Performance Battery, pt has a frailty rating of 11/12 with </= 5/12 considered frail and a score of </= 10/12 indicates one or more mobility limitations.  Pt does not need formal PT at this time due to being able to rise from floor with no difficulties and has gaurdian.       Patient was screened  by Physical Therapist today during multi-disciplinary Geriatric Clinic. Screening questions and  medication/history review were completed by the medical team. Please see MD encounter for full assessment.        Based on above findings, pt is at a low fall risk. Patient does not need formal PT at this time.      Visit Diagnosis: Unsteadiness on feet     Problem List Patient Active Problem List   Diagnosis Date Noted  . Need for follow-up by social worker 02/29/2020  . Insomnia disorder, with non-sleep disorder mental comorbidity, persistent 02/29/2020  . Hx of bipolar disorder 02/29/2020  . Septic shock (HCC) 10/31/2017    Garen Lah, PT Certified Exercise Expert for the Aging Adult  03/21/20 2:56 PM Phone: [phone] Fax: [phone]  [facility] Outpatient Rehabilitation [facility] [address] [place], [place], [address] Phone: [phone]   Fax:  [phone]  Name: Heavenlee Maiorana MRN: [id number] Date of Birth: [birth year]

## 2020-03-22 ENCOUNTER — Encounter: Payer: Self-pay | Admitting: Family Medicine

## 2020-03-22 DIAGNOSIS — Z550 Illiteracy and low-level literacy: Secondary | ICD-10-CM | POA: Insufficient documentation

## 2020-03-22 DIAGNOSIS — R94120 Abnormal auditory function study: Secondary | ICD-10-CM | POA: Insufficient documentation

## 2020-03-22 DIAGNOSIS — I700 Atherosclerosis of aorta: Secondary | ICD-10-CM

## 2020-03-22 DIAGNOSIS — F17210 Nicotine dependence, cigarettes, uncomplicated: Secondary | ICD-10-CM | POA: Insufficient documentation

## 2020-03-22 DIAGNOSIS — H547 Unspecified visual loss: Secondary | ICD-10-CM | POA: Insufficient documentation

## 2020-03-22 DIAGNOSIS — F0390 Unspecified dementia without behavioral disturbance: Secondary | ICD-10-CM | POA: Insufficient documentation

## 2020-03-22 DIAGNOSIS — I7121 Aneurysm of the ascending aorta, without rupture: Secondary | ICD-10-CM | POA: Insufficient documentation

## 2020-03-22 DIAGNOSIS — F79 Unspecified intellectual disabilities: Secondary | ICD-10-CM | POA: Insufficient documentation

## 2020-03-22 DIAGNOSIS — F411 Generalized anxiety disorder: Secondary | ICD-10-CM | POA: Insufficient documentation

## 2020-03-22 DIAGNOSIS — F4310 Post-traumatic stress disorder, unspecified: Secondary | ICD-10-CM | POA: Insufficient documentation

## 2020-03-22 HISTORY — DX: Atherosclerosis of aorta: I70.0

## 2020-03-22 HISTORY — DX: Post-traumatic stress disorder, unspecified: F43.10

## 2020-03-22 LAB — VITAMIN B12: Vitamin B-12: 239 pg/mL (ref 232–1245)

## 2020-03-22 LAB — HEPATITIS C ANTIBODY: Hep C Virus Ab: <0.1 s/co ratio (ref 0.0–0.9)

## 2020-03-22 LAB — RPR: RPR Ser Ql: NONREACTIVE

## 2020-03-22 LAB — FOLATE: Folate: 8 ng/mL (ref >3.0)

## 2020-03-22 LAB — TSH: TSH: 0.771 u[IU]/mL (ref 0.450–4.500)

## 2020-03-22 LAB — HIV ANTIBODY (ROUTINE TESTING W REFLEX): HIV Screen 4th Generation wRfx: NONREACTIVE

## 2020-03-22 NOTE — Assessment & Plan Note (Signed)
New problem This may be impairing Christina Everett's behavior and could contribute to the appearance of dementia.  Recommend referral to ophthalmology once her insurance enrollment for outpatient medical care is assured.

## 2020-03-22 NOTE — Assessment & Plan Note (Addendum)
Provisional diagnosis Informant reports indicate a significant decline in cognition and life skills since death of her mother and leaving her childhood home.  Other possible causes of decline could include: sensory impairment given failure on screening hearing and decreased vision (patient was given significant cueing to complete the tumbling E Snellen's test), Mental health disorder(s), Sleep problems, and stressful life events including loss of her parent's and her home.   The stage of this provisional diagnosis is very hard to assign given the lower baseline in life skill set.  It is probably best just to decribe the set of activities she can perform, including, caring for her home environment, a concrete language that is adequate for her needs, and she is able to perform all her basic activity of daily living.    Behaviors of hiding and losing items, refusal of basic ADLs, anxiety at separation from caretakers, and constant motor activity are  stressf burdens on Christina Everett's caretakers:   Will try a trial of  donepezil 5 mg daily for 2 months then revisit if they believe the medication helped with these behaviors. If there is no improvement, then would stop the med.  We talked with the caretakers about the risk of GI symptoms and bradycardia with accompanying issues like passing out with donepezil.    Recommended that Christina Everett's caretakers scheudle her an intake appointment at [facility] to assess for any ongoing psychiatric disorders that may be complicating her cognitive and skill capcities.

## 2020-03-22 NOTE — Assessment & Plan Note (Signed)
Uncertain from where and how this diagnosis came.

## 2020-03-22 NOTE — Assessment & Plan Note (Addendum)
4/72019 CT Chest: 7 mm left upper lobe pulmonary nodule may be scar.  Non-contrast chest CT at 6-12 months is recommended. If the nodule is stable at time of repeat CT, then future CT at 18-24 months (from today's scan) is considered optional for low-risk patients, but is recommended for high-risk patients  Once patient's insurance covering outpatient medical costs is active, then would CTA Chest to follow up both thoracic aneurysm and lung nodule at the same time.

## 2020-03-22 NOTE — Assessment & Plan Note (Signed)
Longstanding intellectual disability. Did attend special needs classes in school.  Family does not think she went for more than 5 or 6 six of school.  Christina Everett is unable to read or write.

## 2020-03-22 NOTE — Progress Notes (Signed)
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 hearing and decreased vision (patient was given significant cueing to complete the tumbling E Snellen's test), Mental health disorder(s), Sleep problems, and stressful life events including loss of her parent's and her home.   The stage of this provisional diagnosis is very hard to assign given the lower baseline in life skill set.  It is probably best just to decribe the set of activities she can perform, including, caring for her home environment, a concrete language that is adequate for her needs, and she is able to perform all her basic activity of daily living.     Behaviors of hiding and losing items, refusal of basic ADLs,  anxiety at separation from caretakers, and constant motor activity are  stressf burdens on Ms Ragle's caretakers:   Will try a trial of  donepezil 5 mg daily for 2 months then revisit if they believe the medication helped with these behaviors. If there is no improvement, then would stop the med.  We talked with the caretakers about the risk of GI symptoms and bradycardia with accompanying issues like passing out with donepezil.    Aortic atherosclerosis (HCC) Established problem. Stable. Will benefit from statin, smoking cessation.    Insomnia disorder, with non-sleep disorder mental comorbidity, persistent Established problem that has improved.  Caretakers report patient sleeping throughout the night without excessive somnolence in morning.   We discussed risks of using antipsychotics for behavioral and psychological symptoms of dementia (if this is dementia).  She is on a very low dose of Quetiapine 25 mg qhs.  Recommend trial of gradual dose reduction in 4 to 6 months to see if patient still needs this medication.  Monitor for DM, lipids and weight periodically.      Cigarette smoker Not discussed at this visit.  An improtant future topic given the thoracic aortic aneurysm.    Failed hearing screening New issue Recommend repeat hearing screen at next ov.  There was some uncertainty as to whether Ms Wieczorek understood the handheld audiometry directions.   Visual impairment New problem This may be impairing Ms Coe's behavior and could contribute to the appearance of dementia.  Recommend referral to ophthalmology once her insurance enrollment for outpatient medical care is assured.       PHYSICAL THERAPY assessment and plan  A physical therapy screen was completed in today's geriatric interdisciplinary assessment clinic visit.  The patient was not identified at a high fall risk.  No further PT services are  recommended.       Patient to Follow up with  Dr.Simmons-Robinson  > 60 minutes face to face were spent in total with interdisciplinary discussion, patient and caretaker counseling and coordination of care took more than 20 minutes. The Geriatric interdisciplinary team meet to discuss the patient's assessment, problem list, and recommendations.  The interdisciplinary team consisted of representatives from medicine, pharmacy, physical therapy and social work. The interdisciplinary team meet with the patient and caretakers to review the team's findings, assessments, and recommendations.

## 2020-03-22 NOTE — Assessment & Plan Note (Signed)
New issue Recommend repeat hearing screen at next ov.  There was some uncertainty as to whether Christina Everett understood the handheld audiometry directions.

## 2020-03-22 NOTE — Assessment & Plan Note (Signed)
Established problem. Stable. Will benefit from statin, smoking cessation.

## 2020-03-22 NOTE — Assessment & Plan Note (Addendum)
Not discussed at this visit.  An improtant future topic given the thoracic aortic aneurysm.

## 2020-03-22 NOTE — Assessment & Plan Note (Addendum)
11/01/2107 CT Chest  Ascending thoracic aorta measures up to 4.3 cm diameter.  Radiology interpreter Recommended annual imaging followup by CTA or MRA per ACC/AHA guidelines.  Once patient's insurance covering outpatient medical costs is active, then would CTA Chest to follow up aneurysm diameter.   CV risk reduction with BP goal 105 to 120 mmHg.  Beta-blocker therapy preferred bc of pulse pressure reduction of shear effect.  Smoking cessation and lipid management recommended. Christina Everett

## 2020-03-22 NOTE — Assessment & Plan Note (Signed)
Established problem that has improved.  Caretakers report patient sleeping throughout the night without excessive somnolence in morning.   We discussed risks of using antipsychotics for behavioral and psychological symptoms of dementia (if this is dementia).  She is on a very low dose of Quetiapine 25 mg qhs.  Recommend trial of gradual dose reduction in 4 to 6 months to see if patient still needs this medication.  Monitor for DM, lipids and weight periodically.

## 2020-04-02 ENCOUNTER — Other Ambulatory Visit: Payer: Self-pay

## 2020-04-05 ENCOUNTER — Ambulatory Visit: Payer: Self-pay | Admitting: Licensed Clinical Social Worker

## 2020-04-05 DIAGNOSIS — Z636 Dependent relative needing care at home: Secondary | ICD-10-CM

## 2020-04-05 NOTE — Chronic Care Management (AMB) (Signed)
Care Management   Clinical Social Work Follow Up   04/05/2020 Name: Christina Everett MRN: [id number] DOB: Nov 25, 1955 Referred by: Christina Ramp, MD  Reason for referral : Care Coordination (F/U call)  Christina Everett is a 64 y.o. year old female who is a primary care patient of Simmons-Robinson, Tawanna Cooler, MD.  Reason for follow-up: assess for barriers and progress with care plan .   Assessment: Patient's family continues to experience difficulty with completing care plan goals, however they have started the process which has decrease the level of stress for caregiver. Patient caregiver would like continued follow-up from CCM LCSW. Plan:  1. Caregiver will call office if needed 2.  LCSW will F/U in 4 to 5 weeks Advance Directive Status: N See Care Plan  for related entries.  SDOH (Social Determinants of Health) assessments performed:  ; No new needs identified   Goals Addressed            This Visit's Progress   . Advance Directive   Not on track    CARE PLAN ENTRY (see longitudinal plan of care for additional care plan information)  Current Barriers:  . Patient does not have an Proofreader . Patient and caregiver acknowledges deficits, education and support in order to complete this document . Unable to complete until patient has her ID Clinical Social Work Goal(s): Over the next 30 to 60 days,  . the patient and caregiver will review and complete Advance Directive packet, have notarized and provide a copy to provider office Interventions provided by LCSW: . Assessed understanding of Advance Directives . A voluntary discussion about advanced care planning including importance of advanced directives, healthcare proxy and living will was discussed with the patient, sister and niece.  . patient and caregivers have educational information on Advance Directives as well as an Engineer, production . Discussed options of legal guardian; also reminded patient's sister patient  will need to have her ID in to have advance directive notarized.   Patient Self Care Activities:  . Is able to complete documentation with help of family . Able to identify Health Care Power of [facility] / Health Care Agent . patient and caregiver will call LCSW with questions Please see past updates related to this goal by clicking on the "Past Updates" button in the selected goal     . Caregiver stress   Not on track    CARE PLAN ENTRY (see longitudinal plan of care for additional care plan information)  Current Barriers:  . Patient and caregiver needs community resources support (ID for patient, Medicaid, SS disability and concerns with family dynamics)  . Patient and caregiver acknowledges deficits and needs support, education and care coordination in order to meet this unmet need  . Family feeling much better with stressors due to being able to accomplish things to help patient . Has requested SS card for patient,  family continues to run into barriers with getting SSA, SS card and Medicare take in place . They have reached out to [facility] administration and waiting for return call for a face to face appointment. Clinical Goal(s)  . Over the next 30 days, patient and caregiver will work with various community agency to meet needs Interventions provided by LCSW:  . Assessment of needs and barriers to care as well as how impacting    . Discussed and provided patient's caregiver with information about The department of Social Services, Designer, jewellery. . Reviewed process with agencies discussed options (decided  not to move forward with Legal aid, has contacted SSA for social security card) . Motivational Interviewing and emotional support Patient Self Care Activities & Deficits:  . Patient is unable to independently navigate community resource options without care coordination support  . Patient's caregiver is motivated to resolve concern and is able to contact  agencies as discussed today Please see past updates related to this goal by clicking on the "Past Updates" button in the selected goal     . mental health provider   Not on track    CARE PLAN ENTRY (see longitudinal plan of care for additional care plan information)  Current Barriers:  . Patient  with Bipolar Disorder and Mentally disabled acknowledges deficits with connecting to mental health provider for reestablishing care   . Patient and caregiver needs Support, Education, and Care Coordination in order to meet unmet mental health needs  . Patient previously seen at [facility]  . Patient unable to go to [facility] until she has her ID,(currently working to obtain it). . PCP changed patient's medication to Seroquel which is working much better . Goal currently on hold Clinical Social Work Goal(s):  Marland Kitchen Over the next 30 days, patient's support system will work with SW to reconnected for ongoing counseling and medication.  Interventions:  . Assessed patient's barriers with connecting to [facility] . Discussed several options for long term counseling and medication management based on need and insurance.  (family would like patient to return to [facility]  ) . Contact information provided for [facility] . Solution focused intervention provided  Patient Self Care Activities & Deficits:  . Patient is unable to independently navigate community resource options without care coordination support . Patient's caregiver is able to implement clinical interventions discussed today  Please see past updates related to this goal by clicking on the "Past Updates" button in the selected goal        Outpatient Encounter Medications as of 04/05/2020  Medication Sig  . acetaminophen (TYLENOL) 500 MG tablet Take 500 mg by mouth every 6 (six) hours as needed for moderate pain or headache.   . donepezil (ARICEPT) 5 MG tablet Take 1 tablet (5 mg total) by mouth at bedtime.  Marland Kitchen LORazepam (ATIVAN) 1 MG tablet Take 1 tab 1 hour  prior to CT head.  Can take 2nd tab at time of CT if needed.  Marland Kitchen QUEtiapine (SEROQUEL) 25 MG tablet Take 1 tablet (25 mg total) by mouth at bedtime.   No facility-administered encounter medications on file as of 04/05/2020.   Review of patient status, including review of consultants reports, relevant laboratory and other test results, and collaboration with appropriate care team members and the patient's provider was performed as part of comprehensive patient evaluation and provision of care management services.    Sammuel Hines, LCSW Care Management & Coordination  [facility] / [facility]   [phone] 11:04 AM

## 2020-05-07 ENCOUNTER — Ambulatory Visit: Payer: Self-pay | Admitting: Licensed Clinical Social Worker

## 2020-05-07 DIAGNOSIS — Z636 Dependent relative needing care at home: Secondary | ICD-10-CM

## 2020-05-07 NOTE — Chronic Care Management (AMB) (Signed)
Care Management   Clinical Social Work Follow Up   05/07/2020 Name: Christina Everett MRN: [id number] DOB: [birth year] Referred by: Ronnald Ramp, MD  Reason for referral : Care Coordination (cargiver support)  Christina Everett is a 64 y.o. year old female who is a primary care patient of Simmons-Robinson, Tawanna Cooler, MD.  Reason for follow-up: assess for barriers and progress with caregiver stress .    Assessment: Patient continues to live with daughter.  LCSW spoke with daughter and niece via conference call.  They continue to encounter barriers with obtaining SS card, SSA check and Medicaid.  Recommendation: Patient may benefit from, and family is in agreement to consult with an attorney to assist with barriers as it related to concerns with family dynamics  .     Review of patient status, including review of consultants reports, relevant laboratory and other test results, and collaboration with appropriate care team members and the patient's provider was performed as part of comprehensive patient evaluation and provision of care management services.   Plan: Patient 's family will implement things discussed today, they will call office if needed. No follow up scheduled with LCSW.  Advance Directive Status: not addressed during this encounter.  SDOH (Social Determinants of Health) assessments performed: No needs identified   Goals Addressed            This Visit's Progress   . Caregiver stress   Not on track    CARE PLAN ENTRY (see longitudinal plan of care for additional care plan information)  Current Barriers:  . Patient and caregiver needs community resources support (ID for patient, Medicaid, SS disability and concerns with family dynamics)  . Patient and caregiver acknowledges deficits and needs support, education and care coordination in order to meet this unmet need  . Family feeling much better with stressors due to being able to accomplish things to help patient . Has  requested SS card for patient,  family continues to run into barriers with getting SSA, SS card, and Medicaid Clinical Goal(s)  . Over the next 30 days, patient and caregiver will work with various community agency to meet needs Interventions provided by LCSW:  . Assessment of needs and barriers to care as well as how impacting    . Discussed and provided patient's caregiver with information about The department of Social Services, Designer, jewellery. . Reviewed process with agencies discussed options and provided phone numbers(Legal aid,  SSA for social security card and DSS for Medicaid) . Motivational Interviewing and emotional support Patient Self Care Activities & Deficits:  . Patient is unable to independently navigate community resource options without care coordination support  . Patient's caregiver is motivated to resolve concern and is able to contact agencies as discussed today Please see past updates related to this goal by clicking on the "Past Updates" button in the selected goal       Outpatient Encounter Medications as of 05/07/2020  Medication Sig  . acetaminophen (TYLENOL) 500 MG tablet Take 500 mg by mouth every 6 (six) hours as needed for moderate pain or headache.   . donepezil (ARICEPT) 5 MG tablet Take 1 tablet (5 mg total) by mouth at bedtime.  Marland Kitchen LORazepam (ATIVAN) 1 MG tablet Take 1 tab 1 hour prior to CT head.  Can take 2nd tab at time of CT if needed.  Marland Kitchen QUEtiapine (SEROQUEL) 25 MG tablet Take 1 tablet (25 mg total) by mouth at bedtime.   No facility-administered encounter medications on  file as of 05/07/2020.   Sammuel Hines, LCSW Care Management & Coordination  [facility] / [facility]   [phone] 2:37 PM

## 2020-05-23 ENCOUNTER — Other Ambulatory Visit: Payer: Self-pay | Admitting: Family Medicine

## 2020-06-10 ENCOUNTER — Telehealth: Payer: Self-pay

## 2020-06-10 NOTE — Telephone Encounter (Signed)
Kendal Hymen, patients sister, calls nurse line stating the patient needs something stronger at bedtime for rest. Kendal Hymen states the patient is up all hours of the night and not getting any sleep and has "bags under her eyes." Kendal Hymen reports giving her (2) donepezil at bedtime with no relief. Please advise.

## 2020-06-11 MED ORDER — RAMELTEON 8 MG PO TABS: 8.0000 mg | ORAL_TABLET | Freq: Every day | ORAL | 0 refills | Status: DC

## 2020-06-11 NOTE — Telephone Encounter (Signed)
Contacted patient sister via telephone with no answer.  Left voicemail informing her that I will prescribe ramelteon to take it at bedtime.  Recommended follow-up in 4 weeks to address efficacy for insomnia.Prescribed  Ramelteon 8 mg at bedtime.

## 2020-06-17 MED ORDER — RAMELTEON 8 MG PO TABS
8.0000 mg | ORAL_TABLET | Freq: Every day | ORAL | 0 refills | Status: DC
Start: 2020-06-17 — End: 2020-11-04

## 2020-06-17 NOTE — Addendum Note (Signed)
Addended by: Glori Bickers C on: 06/17/2020 12:00 PM   Modules accepted: Orders

## 2020-06-17 NOTE — Telephone Encounter (Signed)
Received phone call from patient regarding rozerem rx. Patient is needing medication to be sent to [facility] instead of [facility].   Called [facility] to cancel original prescription. Spoke with Tim, cancelled original rx at [facility]. Resent to [facility].   FYI to PCP  Veronda Prude, RN

## 2020-06-17 NOTE — Addendum Note (Signed)
Addended by: Veronda Prude on: 06/17/2020 11:25 AM   Modules accepted: Orders

## 2020-11-04 ENCOUNTER — Other Ambulatory Visit: Payer: Self-pay

## 2020-11-04 MED ORDER — QUETIAPINE FUMARATE 25 MG PO TABS: 25.0000 mg | ORAL_TABLET | Freq: Every day | ORAL | 0 refills | Status: AC

## 2020-11-04 MED ORDER — RAMELTEON 8 MG PO TABS: 8.0000 mg | ORAL_TABLET | Freq: Every day | ORAL | 0 refills | Status: AC

## 2020-11-04 NOTE — Telephone Encounter (Signed)
Patient's sister calls nurse line requesting refills on sleep medications. Patient has scheduled follow up appointment for 5/5 with PCP.   Please advise.   Veronda Prude, RN

## 2020-11-28 ENCOUNTER — Ambulatory Visit: Payer: Self-pay | Admitting: Family Medicine

## 2021-11-27 ENCOUNTER — Telehealth: Payer: Self-pay

## 2021-11-27 ENCOUNTER — Ambulatory Visit (INDEPENDENT_AMBULATORY_CARE_PROVIDER_SITE_OTHER): Payer: Self-pay | Admitting: Family Medicine

## 2021-11-27 ENCOUNTER — Telehealth: Payer: Self-pay | Admitting: Family Medicine

## 2021-11-27 VITALS — BP 116/70 | HR 71 | Wt 122.2 lb

## 2021-11-27 DIAGNOSIS — Z1231 Encounter for screening mammogram for malignant neoplasm of breast: Secondary | ICD-10-CM

## 2021-11-27 DIAGNOSIS — Z8639 Personal history of other endocrine, nutritional and metabolic disease: Secondary | ICD-10-CM

## 2021-11-27 DIAGNOSIS — Z0000 Encounter for general adult medical examination without abnormal findings: Secondary | ICD-10-CM

## 2021-11-27 DIAGNOSIS — Z1211 Encounter for screening for malignant neoplasm of colon: Secondary | ICD-10-CM

## 2021-11-27 DIAGNOSIS — R634 Abnormal weight loss: Secondary | ICD-10-CM

## 2021-11-27 DIAGNOSIS — R911 Solitary pulmonary nodule: Secondary | ICD-10-CM

## 2021-11-27 NOTE — Progress Notes (Signed)
? ? ?  SUBJECTIVE:  ? ?CHIEF COMPLAINT / HPI:  ? ?General check up ?History of demential and intellectual delay. Today accompanied by her sister in law. States they are following up for a check up because they have been asked to do so by social services. Ms. Mccravy states that she is well and she has no concerns today. She is not currently taking any medications and will occasionally have a cigarette but will not inhale. Unsure of how long she has been smoking. Unaware that she has a lung nodule on her last CT scan. Has never had a colonoscopy or mammogram.  ? ?PERTINENT  PMH / PSH: TAA, possible dementia, 65mm LUL nodule on CT, illiterate, insomnia, anxiety, visual impairment ? ?OBJECTIVE:  ? ?BP 116/70   Pulse 71   Wt 122 lb 3.2 oz (55.4 kg)   SpO2 95%   BMI 20.98 kg/m?   ?Physical Exam ?Vitals reviewed.  ?Constitutional:   ?   General: She is not in acute distress. ?   Appearance: She is not ill-appearing, toxic-appearing or diaphoretic.  ?Eyes:  ?   Conjunctiva/sclera: Conjunctivae normal.  ?Cardiovascular:  ?   Rate and Rhythm: Normal rate and regular rhythm.  ?Pulmonary:  ?   Effort: Pulmonary effort is normal. No respiratory distress.  ?   Breath sounds: Normal breath sounds. No stridor. No wheezing.  ?Abdominal:  ?   Palpations: Abdomen is soft. There is no mass.  ?   Tenderness: There is no abdominal tenderness. There is no guarding.  ?Musculoskeletal:  ?   Cervical back: Neck supple.  ?Lymphadenopathy:  ?   Cervical: No cervical adenopathy.  ?Skin: ?   Findings: No bruising or rash.  ?Neurological:  ?   Mental Status: She is alert and oriented to person, place, and time.  ?Psychiatric:     ?   Mood and Affect: Mood normal.     ?   Behavior: Behavior normal.  ? ? ? ?ASSESSMENT/PLAN:  ? ?1. Encounter for physical examination ?Asked to come due to social service and needs ID card papers filled out. Hx of intellectual delay, illiteracy, possible dementia, and visual impairment. Mother was primary caregiver who  has since passed and now sister is primary caregiver. Will order cancer screenings today. Follow up with PCP for immunizations if desires. COVID vaccine declined today.  ?- Ambulatory referral to Gastroenterology ?- MM Digital Screening; Future ?- Needs MOCA at follow up with PCP or consider geri clinic follow up ? ?2. History of hyperglycemia ?Hx of weight loss and not seen in office for 2 years. Basic labs with screenings above.  ?- Comprehensive metabolic panel ? ?3. Weight loss ?30 lbs weight loss in 2021. Not seen in clinic since this time. Will obtain basic labs and order for cancer screenings today.  ?- CBC ?- TSH ? ?4. Solitary pulmonary nodule on lung CT ?Reviewed and discussed previous findings and need to follow up given the size and smoking hx.  ?- CT CHEST NODULE FOLLOW UP LOW DOSE W/O; Future ? ?5. Encounter for screening mammogram for malignant neoplasm of breast ?Handout with number and address given. Encouraged to schedule. ?- MM Digital Screening; Future ? ?6. Screening for colon cancer ?- Ambulatory referral to Gastroenterology ? ?Nyelle Wolfson Autry-Lott, DO ?[facility]  ?

## 2021-11-27 NOTE — Patient Instructions (Addendum)
It was wonderful to see you today. ? ?Please bring ALL of your medications with you to every visit.  ? ?Today we talked about: ? ?-You will be called to schedule your colonoscopy ?- Please go to the breast center to get your mammogram completed ?- We will get you scheduled for your lung CT scan and give you a call when this is completed ?- I will give you a call if any labs are abnormal otherwise I will send a letter. ?-Please talk to the pharmacy or the health department for your vaccines you currently need Tdap, shingles, then a pneumonia vaccine and COVID vaccines. ?- Follow-up with your primary care doctor in 2 to 4 weeks ? ? ?Please be sure to schedule follow up at the front  desk before you leave today.  ? ?If you haven't already, sign up for My Chart to have easy access to your labs results, and communication with your primary care physician. ? ?Please call the clinic at [phone] if your symptoms worsen or you have any concerns. It was our pleasure to serve you. ? ?Dr. Salvadore Dom ? ?

## 2021-11-27 NOTE — Telephone Encounter (Signed)
Will inform patient of upcoming appointment for CT at visit on 11/28/2021. ? ?Marland KitchenGlennie Hawk, CMA ? ?

## 2021-11-28 ENCOUNTER — Telehealth: Payer: Self-pay

## 2021-11-28 ENCOUNTER — Encounter: Payer: Self-pay | Admitting: Family Medicine

## 2021-11-28 ENCOUNTER — Ambulatory Visit (INDEPENDENT_AMBULATORY_CARE_PROVIDER_SITE_OTHER): Payer: Self-pay | Admitting: Family Medicine

## 2021-11-28 ENCOUNTER — Other Ambulatory Visit: Payer: Self-pay

## 2021-11-28 DIAGNOSIS — F79 Unspecified intellectual disabilities: Secondary | ICD-10-CM

## 2021-11-28 LAB — CBC
Hematocrit: 41.2 % (ref 34.0–46.6)
Hemoglobin: 12.7 g/dL (ref 11.1–15.9)
MCH: 26.4 pg — ABNORMAL LOW (ref 26.6–33.0)
MCHC: 30.8 g/dL — ABNORMAL LOW (ref 31.5–35.7)
MCV: 86 fL (ref 79–97)
Platelets: 405 10*3/uL (ref 150–450)
RBC: 4.81 x10E6/uL (ref 3.77–5.28)
RDW: 13.4 % (ref 11.7–15.4)
WBC: 7.6 10*3/uL (ref 3.4–10.8)

## 2021-11-28 LAB — COMPREHENSIVE METABOLIC PANEL
ALT: 9 IU/L (ref 0–32)
AST: 16 IU/L (ref 0–40)
Albumin/Globulin Ratio: 1.7 (ref 1.2–2.2)
Albumin: 4.7 g/dL (ref 3.8–4.8)
Alkaline Phosphatase: 70 IU/L (ref 44–121)
BUN/Creatinine Ratio: 14 (ref 12–28)
BUN: 12 mg/dL (ref 8–27)
Bilirubin Total: <0.2 mg/dL (ref 0.0–1.2)
CO2: 21 mmol/L (ref 20–29)
Calcium: 9.7 mg/dL (ref 8.7–10.3)
Chloride: 104 mmol/L (ref 96–106)
Creatinine, Ser: 0.88 mg/dL (ref 0.57–1.00)
Globulin, Total: 2.7 g/dL (ref 1.5–4.5)
Glucose: 92 mg/dL (ref 70–99)
Potassium: 4.2 mmol/L (ref 3.5–5.2)
Sodium: 143 mmol/L (ref 134–144)
Total Protein: 7.4 g/dL (ref 6.0–8.5)
eGFR: 72 mL/min/{1.73_m2} (ref >59)

## 2021-11-28 LAB — TSH: TSH: 1.23 u[IU]/mL (ref 0.450–4.500)

## 2021-11-28 NOTE — Progress Notes (Signed)
? ? ?  SUBJECTIVE:  ? ?CHIEF COMPLAINT / HPI:  ? ?Form Completion ?Christina Everett is a 66 y.o. female who presents to the [facility] clinic for form completion. She was seen in the clinic yesterday for her physical.  ? ?Previously lived under mothers care until she passed away.  ? ?Now lives with sister since 2013. She is requesting DMV paperwork to be filled out so that she can get a special identification card so that the sister doesn't have to pay to get her a card.  ? ?Sister states that her cousin had called social security on her sister due to concerns that her social security money was being taken from her. Because of this, she is pushed for time to get her ID, social security card, medical record, etc together until Monday.  ? ?APS met with Christina Everett independently and asked whether she was being properly taken care of and fed and that APS was able to find three cabinets full of foods.  ? ?Sister states that Christina Everett is unable to operate a cellphone. She doesn't realize she has change and will leave stores without getting her change. She cannot read and can only write at a first grade level- per her sister.  ? ?PERTINENT  PMH / PSH:  ?Intellectual disability ? ? ?OBJECTIVE:  ? ?BP 130/79   Pulse 84   Wt 122 lb 6.4 oz (55.5 kg)   SpO2 100%   BMI 21.01 kg/m?   ? ?General: NAD, pleasant, able to participate in exam ?Respiratory: Normal respiratory effort ?Neuro: awake, alert. Knows name and building but unable to name state, country or city without assistance. Unable to say year, season, or month. ?Psych: Normal affect and mood ? ?ASSESSMENT/PLAN:  ? ?Intellectual disability ?Chronic and has had caretaker for her entire life- sister has been caretaker for the last 76 years and is HCPOA. Filled out form today for [employer] special identification card- feel this is appropriate.  ?  ? ? ?Sharion Settler, DO ?[place]  ? ?

## 2021-11-28 NOTE — Telephone Encounter (Signed)
Spoke to patient's sister Horris Latino at appointment today and was told that patient does not have insurance.  There fore she will not be getting CT scan for chest nodule. ? ?.Ozella Almond, CMA ? ?

## 2021-11-28 NOTE — Assessment & Plan Note (Signed)
Chronic and has had caretaker for her entire life- sister has been caretaker for the last 10 years and is HCPOA. Filled out form today for [employer] special identification card- feel this is appropriate.  ?

## 2021-11-28 NOTE — Patient Instructions (Addendum)
It was wonderful to see you today. ? ? ?Today we talked about: ? ?-You are scheduled for a CT next Monday at 1 PM at [facility]. Please arrive at least 15 minutes early. ?-I have filled out the [facility] form for a special identification card. ? ?Thank you for choosing [facility].  ? ?Please call [phone] with any questions about today's appointment. ? ?Please be sure to schedule follow up at the front  desk before you leave today.  ? ?Sabino Dick, DO ?PGY-2 Family Medicine   ?

## 2021-11-28 NOTE — Telephone Encounter (Signed)
Acknowleged reason for plan to not have CT to follow up lung nodule.  ? We can revisit at next scheduled visit on 12/19/21.  ? ?Ronnald Ramp, MD ?[facility] Medicine, PGY-3 ?[phone]  ? ?

## 2021-12-01 ENCOUNTER — Ambulatory Visit: Payer: Self-pay

## 2021-12-02 ENCOUNTER — Telehealth: Payer: Self-pay

## 2021-12-02 NOTE — Telephone Encounter (Signed)
Christina Everett with Adult Protective Services LVM on nurse line requesting to speak with PCP.  ? ?Christina Everett reports she is following up on a recent CPS case opened and would like PCP insight.  ? ?Will forward to PCP. ? ? ?

## 2021-12-03 NOTE — Telephone Encounter (Addendum)
Contacted APS to discuss recent recommendations per clinic notes from Drs. Espinoza and Autry-Lott. Noted that I personally have not seen Ms. Huntoon since 2021.  ?Updates provided. Notified that patient has appt scheduled for 12/19/21 with me.  ? ?Ronnald Ramp, MD ?[facility] Family Medicine, PGY-3 ?[phone]  ? ?

## 2021-12-15 NOTE — Progress Notes (Deleted)
    SUBJECTIVE:   CHIEF COMPLAINT / HPI: f/u   ***  PERTINENT  PMH / PSH: ***  OBJECTIVE:   There were no vitals taken for this visit.  Physical Exam  ASSESSMENT/PLAN:   No problem-specific Assessment & Plan notes found for this encounter.     Ronnald Ramp, MD [facility]

## 2021-12-19 ENCOUNTER — Ambulatory Visit: Payer: Self-pay | Admitting: Family Medicine

## 2022-01-05 ENCOUNTER — Other Ambulatory Visit: Payer: Self-pay | Admitting: Family Medicine

## 2022-01-05 ENCOUNTER — Telehealth: Payer: Self-pay | Admitting: *Deleted

## 2022-01-05 DIAGNOSIS — Z5941 Food insecurity: Secondary | ICD-10-CM

## 2022-01-05 DIAGNOSIS — Z789 Other specified health status: Secondary | ICD-10-CM

## 2022-01-05 NOTE — Telephone Encounter (Signed)
CCM referral submitted.

## 2022-01-05 NOTE — Progress Notes (Signed)
Referral submitted to connect patient with chronic care management for community resources and food insecurity.  Ronnald Ramp, MD [facility] Family Medicine, PGY-3 [phone]

## 2022-01-05 NOTE — Telephone Encounter (Signed)
[employer] called requesting a referral to speak with social work regarding food stamps and other services patient is needing.  Will forward to MD. Burnard Hawthorne

## 2022-01-06 ENCOUNTER — Telehealth: Payer: Self-pay | Admitting: *Deleted

## 2022-01-06 NOTE — Telephone Encounter (Signed)
   Telephone encounter was:  Unsuccessful.  01/06/2022 Name: Christina Everett MRN: [id number] DOB: 1956-04-05  Unsuccessful outbound call made today to assist with:  Food Insecurity  Outreach Attempt:  1st Attempt  A HIPAA compliant voice message was left requesting a return call.  Instructed patient to call back at   Instructed patient to call back at [phone]  at their earliest convenience. Yehuda Mao Greenauer -[facility] Guide , Embedded Care Coordination [facility], Care Management  [phone] [address] [place] , [place] [address] Email : [email or web address]

## 2022-01-07 ENCOUNTER — Telehealth: Payer: Self-pay | Admitting: *Deleted

## 2022-01-07 NOTE — Telephone Encounter (Signed)
   Telephone encounter was:  Unsuccessful.  01/07/2022 Name: Christina Everett MRN: [id number] DOB: 08-Jan-1956  Unsuccessful outbound call made today to assist with:  Food Insecurity  Outreach Attempt:  1st Attempt  A HIPAA compliant voice message was left requesting a return call.  Instructed patient to call back at   Instructed patient to call back at [phone]  at their earliest convenience. Yehuda Mao Greenauer -[facility] Guide , Embedded Care Coordination [facility], Care Management  [phone] [address] [place] , [place] [address] Email : [email or web address]

## 2022-01-08 ENCOUNTER — Telehealth: Payer: Self-pay | Admitting: *Deleted

## 2022-01-08 NOTE — Telephone Encounter (Signed)
   Telephone encounter was:  Unsuccessful.  01/08/2022 Name: Argelia Formisano MRN: [id number] DOB: 1956-03-05  Unsuccessful outbound call made today to assist with:  Food Insecurity  Outreach Attempt:  2nd Attempt  A HIPAA compliant voice message was left requesting a return call.  Instructed patient to call back at   Instructed patient to call back at [phone]  at their earliest convenience. Yehuda Mao Greenauer -[facility] Guide , Embedded Care Coordination [facility], Care Management  [phone] [address] [place] , [place] [address] Email : [email or web address]

## 2022-01-20 ENCOUNTER — Telehealth: Payer: Self-pay | Admitting: *Deleted
